# Patient Record
Sex: Female | Born: 1955 | Race: White | Hispanic: No | Marital: Married | Smoking: Never smoker
Health system: Southern US, Community
[De-identification: ages and names within clinical notes are randomized; demographics above are authoritative.]

## PROBLEM LIST (undated history)

## (undated) DIAGNOSIS — M940 Chondrocostal junction syndrome [Tietze]: Secondary | ICD-10-CM

## (undated) DIAGNOSIS — K219 Gastro-esophageal reflux disease without esophagitis: Secondary | ICD-10-CM

## (undated) DIAGNOSIS — F329 Major depressive disorder, single episode, unspecified: Secondary | ICD-10-CM

## (undated) DIAGNOSIS — G43909 Migraine, unspecified, not intractable, without status migrainosus: Secondary | ICD-10-CM

## (undated) DIAGNOSIS — F32A Depression, unspecified: Secondary | ICD-10-CM

## (undated) DIAGNOSIS — N393 Stress incontinence (female) (male): Secondary | ICD-10-CM

## (undated) DIAGNOSIS — E785 Hyperlipidemia, unspecified: Secondary | ICD-10-CM

## (undated) DIAGNOSIS — K5790 Diverticulosis of intestine, part unspecified, without perforation or abscess without bleeding: Secondary | ICD-10-CM

## (undated) DIAGNOSIS — K621 Rectal polyp: Secondary | ICD-10-CM

## (undated) DIAGNOSIS — K5730 Diverticulosis of large intestine without perforation or abscess without bleeding: Secondary | ICD-10-CM

## (undated) HISTORY — DX: Diverticulosis of large intestine without perforation or abscess without bleeding: K57.30

## (undated) HISTORY — DX: Diverticulosis of intestine, part unspecified, without perforation or abscess without bleeding: K57.90

## (undated) HISTORY — DX: Hyperlipidemia, unspecified: E78.5

## (undated) HISTORY — DX: Rectal polyp: K62.1

## (undated) HISTORY — PX: UMBILICAL HERNIA REPAIR: SHX196

## (undated) HISTORY — DX: Chondrocostal junction syndrome (tietze): M94.0

## (undated) HISTORY — DX: Gastro-esophageal reflux disease without esophagitis: K21.9

---

## 1992-02-05 HISTORY — PX: ABDOMINAL HYSTERECTOMY: SHX81

## 1997-08-10 ENCOUNTER — Other Ambulatory Visit: Admission: RE | Admit: 1997-08-10 | Discharge: 1997-08-10 | Payer: Self-pay | Admitting: Sports Medicine

## 1997-11-02 ENCOUNTER — Other Ambulatory Visit: Admission: RE | Admit: 1997-11-02 | Discharge: 1997-11-02 | Payer: Self-pay | Admitting: Obstetrics and Gynecology

## 1999-03-05 ENCOUNTER — Encounter: Admission: RE | Admit: 1999-03-05 | Discharge: 1999-03-05 | Payer: Self-pay | Admitting: Obstetrics and Gynecology

## 1999-03-05 ENCOUNTER — Encounter: Payer: Self-pay | Admitting: Obstetrics and Gynecology

## 2000-06-06 ENCOUNTER — Other Ambulatory Visit: Admission: RE | Admit: 2000-06-06 | Discharge: 2000-06-06 | Payer: Self-pay | Admitting: Obstetrics and Gynecology

## 2001-05-30 ENCOUNTER — Emergency Department (HOSPITAL_COMMUNITY): Admission: EM | Admit: 2001-05-30 | Discharge: 2001-05-30 | Payer: Self-pay | Admitting: *Deleted

## 2003-05-05 ENCOUNTER — Other Ambulatory Visit: Admission: RE | Admit: 2003-05-05 | Discharge: 2003-05-05 | Payer: Self-pay | Admitting: Obstetrics and Gynecology

## 2004-12-03 ENCOUNTER — Encounter: Admission: RE | Admit: 2004-12-03 | Discharge: 2004-12-03 | Payer: Self-pay | Admitting: Neurology

## 2006-02-04 HISTORY — PX: COLONOSCOPY: SHX174

## 2007-08-20 ENCOUNTER — Ambulatory Visit: Payer: Self-pay | Admitting: Internal Medicine

## 2007-08-25 ENCOUNTER — Telehealth: Payer: Self-pay | Admitting: Internal Medicine

## 2007-08-31 ENCOUNTER — Encounter: Payer: Self-pay | Admitting: Internal Medicine

## 2007-08-31 ENCOUNTER — Ambulatory Visit: Payer: Self-pay | Admitting: Internal Medicine

## 2007-09-03 ENCOUNTER — Encounter: Payer: Self-pay | Admitting: Internal Medicine

## 2010-02-04 HISTORY — PX: BREAST SURGERY: SHX581

## 2010-11-19 ENCOUNTER — Ambulatory Visit (HOSPITAL_COMMUNITY)
Admission: RE | Admit: 2010-11-19 | Discharge: 2010-11-19 | Disposition: A | Discharge status: Home / Self Care | Payer: BC Managed Care – PPO | Attending: Psychiatry | Admitting: Psychiatry

## 2010-11-19 DIAGNOSIS — F332 Major depressive disorder, recurrent severe without psychotic features: Secondary | ICD-10-CM | POA: Insufficient documentation

## 2011-02-23 ENCOUNTER — Encounter (HOSPITAL_COMMUNITY): Payer: Self-pay | Admitting: Emergency Medicine

## 2011-02-23 ENCOUNTER — Emergency Department (HOSPITAL_COMMUNITY)
Admission: EM | Admit: 2011-02-23 | Discharge: 2011-02-23 | Disposition: A | Discharge status: Home / Self Care | Payer: BC Managed Care – PPO | Source: Home / Self Care | Attending: Family Medicine | Admitting: Family Medicine

## 2011-02-23 DIAGNOSIS — K5792 Diverticulitis of intestine, part unspecified, without perforation or abscess without bleeding: Secondary | ICD-10-CM

## 2011-02-23 DIAGNOSIS — K5732 Diverticulitis of large intestine without perforation or abscess without bleeding: Secondary | ICD-10-CM

## 2011-02-23 HISTORY — DX: Depression, unspecified: F32.A

## 2011-02-23 HISTORY — DX: Migraine, unspecified, not intractable, without status migrainosus: G43.909

## 2011-02-23 HISTORY — DX: Stress incontinence (female) (male): N39.3

## 2011-02-23 HISTORY — DX: Major depressive disorder, single episode, unspecified: F32.9

## 2011-02-23 LAB — POCT URINALYSIS DIP (DEVICE)
Hgb urine dipstick: NEGATIVE
Specific Gravity, Urine: >=1.03 (ref 1.005–1.030)
pH: 5 (ref 5.0–8.0)

## 2011-02-23 MED ORDER — CIPROFLOXACIN HCL 500 MG PO TABS: 500.0000 mg | ORAL_TABLET | Freq: Two times a day (BID) | ORAL | Status: AC

## 2011-02-23 MED ORDER — METRONIDAZOLE 500 MG PO TABS: 500.0000 mg | ORAL_TABLET | Freq: Three times a day (TID) | ORAL | Status: AC

## 2011-02-23 MED ORDER — ONDANSETRON HCL 4 MG PO TABS: 4.0000 mg | ORAL_TABLET | Freq: Three times a day (TID) | ORAL | Status: AC | PRN

## 2011-02-23 NOTE — ED Notes (Addendum)
Pt. Called re: unresolved diarrhea symptoms. Dr. Juanetta Gosling was consulted and pt was advised to give the medication 48 hrs to take full effect; to return to ED if symptoms worsen. Pt also reminded to avoid foods that exacerbate diverticulitis, and to try the "B.R.A.T." diet until the medication takes full effect and not to resort to Imodium as that might prolong the healing process. Physician also advises that pt may alternate 250mg  acetominophen with 200mg  ibuprofen up to 4x per day for pain.

## 2011-02-23 NOTE — ED Notes (Signed)
Patient also feels urgent need to urinate, but not urinating with this sensation.  Urinated early this am.

## 2011-02-23 NOTE — ED Provider Notes (Signed)
History     CSN: [id number]  Arrival date & time 02/23/11  4098   First MD Initiated Contact with Patient 02/23/11 1005      Chief Complaint  Patient presents with  . Diarrhea    onset one week ago last wednesday.  "explosive diarrhea " following a meal.  next few days spent in bed, continued diarrhea.  went back to work this week.  mid week ate normal diet followed by vomiting, diarrhea, "bubbling" sensation in abdomen.  has had 2 bm's this am one was diarrhea, one was loose/soft.      (Consider location/radiation/quality/duration/timing/severity/associated sxs/prior treatment) HPI Comments: Alice Cruz presents for evaluation of 10 days of watery diarrhea and loose stools. She now reports onset of worsening LEFT lower quadrant pain. She has known diverticular disease on colonoscopy. She denies any fever, and is tolerating PO well. She reports similar sx in her daughter and grandson at the beginning of her symptoms. She also now reports urinary urgency and frequency without dysuria. She takes Vesicare for stress incontinence.   Patient is a 56 y.o. female presenting with diarrhea. The history is provided by the patient.  Diarrhea The primary symptoms include abdominal pain and diarrhea. Primary symptoms do not include fever, weight loss, nausea, vomiting or dysuria. The illness began more than 7 days ago. The onset was sudden. The problem has not changed since onset. The abdominal pain began more than 2 days ago. The abdominal pain has been unchanged since its onset. The abdominal pain is located in the LLQ. The abdominal pain does not radiate. The abdominal pain is relieved by nothing.  The diarrhea began today. The diarrhea is watery and semi-solid.  Significant associated medical issues include diverticulitis.    Past Medical History  Diagnosis Date  . Migraines   . Depression   . Stress incontinence, female     Past Surgical History  Procedure Date  . Cesarean section   .  Abdominal hysterectomy   . Breast surgery     No family history on file.  History  Substance Use Topics  . Smoking status: Never Smoker   . Smokeless tobacco: Not on file  . Alcohol Use: Yes    OB History    Grav Para Term Preterm Abortions TAB SAB Ect Mult Living                  Review of Systems  Constitutional: Negative.  Negative for fever, weight loss and appetite change.  HENT: Negative.   Eyes: Negative.   Respiratory: Negative.   Cardiovascular: Negative.   Gastrointestinal: Positive for abdominal pain and diarrhea. Negative for nausea, vomiting and blood in stool.  Genitourinary: Positive for urgency and frequency. Negative for dysuria.  Musculoskeletal: Negative.   Skin: Negative.   Neurological: Negative.     Allergies  Review of patient's allergies indicates no known allergies.  Home Medications   Current Outpatient Rx  Name Route Sig Dispense Refill  . CLONAZEPAM 1 MG PO TABS Oral Take 1 mg by mouth 2 (two) times daily as needed.    Marland Kitchen PROMETHAZINE HCL 25 MG PO TABS Oral Take 25 mg by mouth every 6 (six) hours as needed.    Marland Kitchen SOLIFENACIN SUCCINATE 10 MG PO TABS Oral Take 5 mg by mouth daily.    Marland Kitchen VILAZODONE HCL 40 MG PO TABS Oral Take by mouth daily.    Marland Kitchen ZONEGRAN PO Oral Take by mouth.    Marland Kitchen CIPROFLOXACIN HCL 500 MG PO TABS  Oral Take 1 tablet (500 mg total) by mouth every 12 (twelve) hours. 14 tablet 0  . METRONIDAZOLE 500 MG PO TABS Oral Take 1 tablet (500 mg total) by mouth 3 (three) times daily. 21 tablet 0  . ONDANSETRON HCL 4 MG PO TABS Oral Take 1 tablet (4 mg total) by mouth every 8 (eight) hours as needed for nausea. 15 tablet 0    BP 109/72  Pulse 77  Temp(Src) 97.4 F (36.3 C) (Oral)  Resp 17  SpO2 97%  Physical Exam  Nursing note and vitals reviewed. Constitutional: She is oriented to person, place, and time. She appears well-developed and well-nourished.  HENT:  Head: Normocephalic and atraumatic.  Eyes: EOM are normal.  Neck:  Normal range of motion.  Pulmonary/Chest: Effort normal.  Abdominal: Soft. Normal appearance and bowel sounds are normal. There is tenderness in the left lower quadrant. There is no rebound and no guarding.  Musculoskeletal: Normal range of motion.  Neurological: She is alert and oriented to person, place, and time.  Skin: Skin is warm and dry.  Psychiatric: Her behavior is normal.    ED Course  Procedures (including critical care time)  Labs Reviewed  POCT URINALYSIS DIP (DEVICE) - Abnormal; Notable for the following:    Bilirubin Urine MODERATE (*)    Ketones, ur TRACE (*)    All other components within normal limits  POCT URINALYSIS DIPSTICK   No results found.   1. Diverticulitis       MDM  Labs reviewed; will treat for diverticulitis w cipro and metronidazole; ondansetron; return if no improvement        Richardo Priest, MD 02/23/11 1126

## 2011-02-25 ENCOUNTER — Encounter: Payer: Self-pay | Admitting: Nurse Practitioner

## 2011-02-25 ENCOUNTER — Ambulatory Visit (INDEPENDENT_AMBULATORY_CARE_PROVIDER_SITE_OTHER): Payer: BC Managed Care – PPO | Admitting: Nurse Practitioner

## 2011-02-25 ENCOUNTER — Other Ambulatory Visit (INDEPENDENT_AMBULATORY_CARE_PROVIDER_SITE_OTHER): Payer: BC Managed Care – PPO

## 2011-02-25 ENCOUNTER — Other Ambulatory Visit: Payer: BC Managed Care – PPO

## 2011-02-25 ENCOUNTER — Telehealth: Payer: Self-pay | Admitting: *Deleted

## 2011-02-25 ENCOUNTER — Telehealth: Payer: Self-pay | Admitting: Internal Medicine

## 2011-02-25 VITALS — BP 90/64 | HR 80 | Ht 61.0 in | Wt 137.0 lb

## 2011-02-25 DIAGNOSIS — R197 Diarrhea, unspecified: Secondary | ICD-10-CM

## 2011-02-25 DIAGNOSIS — R1032 Left lower quadrant pain: Secondary | ICD-10-CM

## 2011-02-25 LAB — COMPREHENSIVE METABOLIC PANEL
ALT: 24 U/L (ref 0–35)
AST: 20 U/L (ref 0–37)
BUN: 13 mg/dL (ref 6–23)
CO2: 27 mEq/L (ref 19–32)
Creatinine, Ser: 1 mg/dL (ref 0.4–1.2)
GFR: 59.98 mL/min — ABNORMAL LOW (ref >60.00)
Total Bilirubin: 0.5 mg/dL (ref 0.3–1.2)

## 2011-02-25 LAB — CBC WITH DIFFERENTIAL/PLATELET
Basophils Absolute: 0 10*3/uL (ref 0.0–0.1)
Basophils Relative: 0.7 % (ref 0.0–3.0)
Eosinophils Absolute: 0.1 10*3/uL (ref 0.0–0.7)
HCT: 38.3 % (ref 36.0–46.0)
Hemoglobin: 13.2 g/dL (ref 12.0–15.0)
Lymphs Abs: 1.4 10*3/uL (ref 0.7–4.0)
MCHC: 34.4 g/dL (ref 30.0–36.0)
Neutro Abs: 2.4 10*3/uL (ref 1.4–7.7)
RDW: 13.6 % (ref 11.5–14.6)

## 2011-02-25 NOTE — Telephone Encounter (Signed)
I left a message for the patient to please call me.  I advised I did need her to come back up to our office after she was done at the lab.  I left a message she will need to come here to pick up her contrast and get the directions for the CT scan . I scheduled the CT scan for Wed 02-27-2011 at 9:30 AM. I did leave our number.

## 2011-02-25 NOTE — Patient Instructions (Signed)
Please go to the basement level to have your labs drawn.  We have scheduled the CT scan for Wed 02-27-2011. You can take Hydrocodone 1 tab every 6 hours as needed for pain.  We will call you with the CT scan results and lab results.   You have been scheduled for a CT scan of the abdomen and pelvis at [facility] ([address]---this is in the same building as [facility] [employer]).   You are scheduled on Wednesday at 9:30 AM . You should arrive 15 minutes prior to your appointment time for registration. Please follow the written instructions below on the day of your exam:  WARNING: IF YOU ARE ALLERGIC TO IODINE/X-RAY DYE, PLEASE NOTIFY RADIOLOGY IMMEDIATELY AT [phone]! YOU WILL BE GIVEN A 13 HOUR PREMEDICATION PREP.  1) Do not eat or drink anything after 5:30 AM  2) You have been given 2 bottles of oral contrast to drink. The solution may tastebetter if refrigerated, but do NOT add ice or any other liquid to this solution. Shake well before drinking.    Drink 1 bottle of contrast @ 7:30 AM(2 hours prior to your exam)  Drink 1 bottle of contrast @ 8:30 AM (1 hour prior to your exam)  You may take any medications as prescribed with a small amount of water except for the following: Metformin, Glucophage, Glucovance, Avandamet, Riomet, Fortamet, Actoplus Met, Janumet, Glumetza or Metaglip. The above medications must be held the day of the exam AND 48 hours after the exam.  The purpose of you drinking the oral contrast is to aid in the visualization of your intestinal tract. The contrast solution may cause some diarrhea. Before your exam is started, you will be given a small amount of fluid to drink. Depending on your individual set of symptoms, you may also receive an intravenous injection of x-ray contrast/dye. Plan on being at [facility] for 30 minutes or long, depending on the type of exam you are having performed.  If you have any questions regarding your exam or if  you need to reschedule, you may call the CT department at [phone] between the hours of 8:00 am and 5:00 pm, Monday-Friday.  ________________________________________________________________________

## 2011-02-25 NOTE — Telephone Encounter (Signed)
I called [facility] and rescheduled the CT scan for THurs 02-28-2011 at 10 AM.  I called and Left a message for the patient that I rescheduled the CT scan for Thurs 02-28-2011 at 10 AM.  I advised her to arrive at 9:45 Am and she should drink the 1st bottle at 8 AM and 2nd bottle at 9 AM.  I advised her to call me if she has any questions.

## 2011-02-25 NOTE — Telephone Encounter (Signed)
[facility] and abdominal pain.  They started her on cipro and flagyl for presumed diverticulitis.  She has had diarrhea for over 2 weeks she missed 6 days of work due to diarrhea 2 weeks ago.  She is still having diarrhea and abdominal pain and wants to be seen. I have offered her an appt for today with Willette Cluster RNP she refuses only wants to see an MD today " I can't miss any more work".  I have asked her again to reconsider and come see Willette Cluster RNP she refuses again and states she will return to the urgent care

## 2011-02-26 LAB — FECAL LACTOFERRIN, QUANT: Lactoferrin: POSITIVE

## 2011-02-26 NOTE — Telephone Encounter (Signed)
I called the home number again today @ 8:30 AM and left another message regarding the date and time of the CT scan which is Thurs 02-28-2011 at 10 Am.  Pt is to NPO after 5:30 AM and she is to drink the contrast at 8 Am and 9 Am.  I also advised that she would need to pick up the oral contrast at our front desk.

## 2011-02-26 NOTE — Progress Notes (Signed)
02/26/2011 MELISHA EGGLETON [id number] 11/23/1955   HISTORY OF PRESENT ILLNESS: Patient is a 56 year old female who had a screening colonoscopy with Dr. Leone Payor in 2007. She is worked in today for evaluation of non-bloody diarrhea and abdominal pain. Several days ago patient developed diarrheal illness which she thought was a stomach bug. No associated nausea. No abdominal pain at onset of diarrhea. Because of the illness patient missed several days of work. Diarrhea sounds low volume. No nocturnal diarrhea. 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 No recent out of the country travel. No recent antibiotics. She began to feel better a few days ago but then diarrhea recurred and she developed acute LLQ pain. Patient went to Urgent Care and given antibiotics for presumed diverticulitis. No dysuria. Urine dipstick at urgent care positive for bilirubin and trace ketones only. She has been on the Cipro and Flagyl for a couple of days now without any significant improvement. She has a bad taste in her mouth  Patient wonders if her anti-depressant could be causing the diarrhea as she recently increased dose and the medication is known to cause diarrhea.    Past Medical History  Diagnosis Date  . Migraines   . Depression   . Stress incontinence, female   . Diverticulosis   . HLD (hyperlipidemia)    Past Surgical History  Procedure Date  . Cesarean section     x 2  . Abdominal hysterectomy   . Breast surgery     breast reduction  . Umbilical hernia repair 08/16/2010    reports that she has never smoked. She has never used smokeless tobacco. She reports that she drinks alcohol. She reports that she does not use illicit drugs. family history includes Diabetes in her maternal aunt, maternal grandmother, and maternal uncle and Heart disease in her paternal grandmother. Allergies  Allergen Reactions  . Nsaids     Long term       Outpatient Encounter  Prescriptions as of 02/25/2011  Medication Sig Dispense Refill  . ciprofloxacin (CIPRO) 500 MG tablet Take 1 tablet (500 mg total) by mouth every 12 (twelve) hours.  14 tablet  0  . clonazePAM (KLONOPIN) 1 MG tablet Take 1 mg by mouth 2 (two) times daily as needed.      . metroNIDAZOLE (FLAGYL) 500 MG tablet Take 1 tablet (500 mg total) by mouth 3 (three) times daily.  21 tablet  0  . ondansetron (ZOFRAN) 4 MG tablet Take 1 tablet (4 mg total) by mouth every 8 (eight) hours as needed for nausea.  15 tablet  0  . promethazine (PHENERGAN) 25 MG tablet Take 25 mg by mouth every 6 (six) hours as needed.      . solifenacin (VESICARE) 10 MG tablet Take 5 mg by mouth daily.      . Vilazodone HCl (VIIBRYD) 40 MG TABS Take by mouth daily.      . Zonisamide (ZONEGRAN PO) Take by mouth.         REVIEW OF SYSTEMS  : Positive for feeling a little "loopy". Positive for urinary frequency without dysuria. All other systems reviewed and negative except where noted in the History of Present Illness.   PHYSICAL EXAM: BP 90/64  Pulse 80  Ht 5\' 1"  (1.549 m)  Wt 62.143 kg (137 lb)  BMI 25.89 kg/m2 General: Well developed white female in no acute distress Head: Normocephalic and atraumatic Eyes:  sclerae anicteric,conjunctive pink. Ears: Normal  auditory acuity Mouth: No deformity or lesions Neck: Supple, no masses.  Lungs: Clear throughout to auscultation Heart: Regular rate and rhythm; no murmurs heard Abdomen: Soft, non distended, nontender. No masses or hepatomegaly noted. Normal Bowel sounds Rectal: not done Musculoskeletal: Symmetrical with no gross deformities  Skin: No lesions on visible extremities Extremities: No edema or deformities noted Neurological: Alert oriented x 4, grossly nonfocal Cervical Nodes:  No significant cervical adenopathy Psychological:  Alert and cooperative. Normal mood and affect  ASSESSMENT AND PLAN;   Several day history of non-bloody diarrhea, now with new onset of  LLQ pain. This could be viral but illness has been going on longer than expected for a viral etiology. Rule out C-Diff or other causes of infectious colitis. With onset of LLQ pain diverticulitis is possible but it isn't usually associated with loose stool. Inflammatory bowel disease is not high on differential diagnoses list. Loose stools could be secondary to Viibryd, especially given recent dose increase. Her associated LLQ pain makes medication side effect seem less likely though not impossible. Her abdominal exam is not overly concerning. It is reasonable to check some labs and stool studies. If negative and pain persist then she will likely need CTscan and / or lower endoscopy depending on her symptoms.  Recommend continuing Cipro and Flagyl until labs and stool studies return. She has Hydrocodone at home and can take one every 6 hours as needed. I will call her with test results and further recommendations after review of labs. Patient has appointment with psychiatrist in a couple of days. She plans to have dose of Viibryd reduced to see if it helps diarrhea.

## 2011-02-26 NOTE — Telephone Encounter (Signed)
Called pt at her work number and asked her if she got my message.  She said she or her husband will pick the contrast up tomorrow the 23rd.  She mentioned Gunnar Fusi said if the stool studies come back negative she wouldn't need the CT scan.  I told her Gunnar Fusi or myself will call her with the results of the stool studies.

## 2011-02-27 ENCOUNTER — Telehealth: Payer: Self-pay | Admitting: Nurse Practitioner

## 2011-02-27 ENCOUNTER — Telehealth: Payer: Self-pay | Admitting: Gastroenterology

## 2011-02-27 ENCOUNTER — Other Ambulatory Visit: Payer: BC Managed Care – PPO

## 2011-02-27 NOTE — Progress Notes (Signed)
i agree with the plan outlined in this note 

## 2011-02-27 NOTE — Telephone Encounter (Signed)
The patient said she wanted to cancel the CT scan due to her not having the pain she was having at this time.  She said the antidepressants she is on is causing diarrhea.  I told her I would cancel it and call Gunnar Fusi to advise.  I did call Gunnar Fusi and she said it was fine to cancel the CT scan.  Gunnar Fusi said that she hasn't seen all the stool study results yet.  I did tell her most of them looked negative expect for the lactoferrin was positive.

## 2011-02-27 NOTE — Telephone Encounter (Signed)
On call note @ 1745. Pt complaining of ongoing diarrhea, not improving, and calling for stool culture results. No fevers, N/V or abd pain. I reviewed her office note from Monday and her stool studies: C diff negative, lactoferrin positive and culture pending. Advised her to push clear liquids, continue Cipro and Flagyl and start Lomotil bid prn. Call the office between 8a and 5p tomorrow discuss further plans with Willette Cluster, NP.

## 2011-02-28 ENCOUNTER — Other Ambulatory Visit: Payer: BC Managed Care – PPO

## 2011-02-28 ENCOUNTER — Other Ambulatory Visit: Payer: Self-pay | Admitting: *Deleted

## 2011-02-28 ENCOUNTER — Telehealth: Payer: Self-pay | Admitting: Nurse Practitioner

## 2011-02-28 MED ORDER — DIPHENOXYLATE-ATROPINE 2.5-0.025 MG PO TABS: ORAL_TABLET | ORAL | Status: DC

## 2011-02-28 NOTE — Telephone Encounter (Signed)
See open encounter

## 2011-02-28 NOTE — Telephone Encounter (Signed)
The patient had talked to Dr Russella Dar after hours last night at 5 :45 PM.  He she was saying she is not any better, diarrhea but no abd pain, Nausea or vomiting.  He advised her to continue the Cipro and Flagyl and to take Lomotil twice daily. He told her to call our office today.  She did call and I called Gunnar Fusi first to tell Gunnar Fusi about her talking to Dr Russella Dar.  She agreed with his suggestions and told me to tell her to stay hydrated and push fluids. I called the patient to advise and she said she was almost out of the Lomotil.  I told her I would send a refill to her pharmacy, [facility] [address]. I told her we will call once we have a final report on the stool culture test.

## 2011-03-01 LAB — STOOL CULTURE

## 2011-03-01 NOTE — Telephone Encounter (Signed)
I called the patient and asked how she was doing.  She said she is much better. She is having no more diarrhea. She is only going 1-2 times a day.  She has no pain. She mentioned the psychiatrist took her off some of her medicatons and she thinks they were causing her some of the problems.  I also advised her that the stool culture results are final and they were totally normal.  She thanked me for calling.

## 2011-03-02 ENCOUNTER — Encounter (HOSPITAL_COMMUNITY): Payer: Self-pay | Admitting: Emergency Medicine

## 2011-03-05 ENCOUNTER — Telehealth: Payer: Self-pay | Admitting: Nurse Practitioner

## 2011-03-07 NOTE — Telephone Encounter (Signed)
Gunnar Fusi had called the pt on the 03-05-2011 and had to leave a message.  This was put in my in box.  I called the patient and she told me Gunnar Fusi had called her.  I told her I would tell Gunnar Fusi I called . The patient told me she was feeling better.

## 2011-04-29 ENCOUNTER — Ambulatory Visit (INDEPENDENT_AMBULATORY_CARE_PROVIDER_SITE_OTHER): Payer: BC Managed Care – PPO | Admitting: Family Medicine

## 2011-04-29 VITALS — BP 102/66 | HR 91 | Temp 97.9°F | Resp 18 | Ht 61.5 in | Wt 131.0 lb

## 2011-04-29 DIAGNOSIS — J029 Acute pharyngitis, unspecified: Secondary | ICD-10-CM

## 2011-04-29 NOTE — Progress Notes (Signed)
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 She also feels like there's a lump in the back of her throat which she cannot clear. She now has a soreness on the left side of her tongue with loss of appetite. She has sinus congestion as well.  Objective: No acute distress  Throat: Red without exudates but does show postnasal drainage, aphthous ulcer on left side, Respiratory distress TMs normal Neck is supple no adenopathy  Assessment: Approximately 5 weeks of sinus congestion, cough and sore throat  Plan chlorhexidine gluconate mouthwashes and amoxicillin 75 twice a day for a week

## 2011-07-20 ENCOUNTER — Ambulatory Visit (INDEPENDENT_AMBULATORY_CARE_PROVIDER_SITE_OTHER): Payer: BC Managed Care – PPO | Admitting: Family Medicine

## 2011-07-20 VITALS — BP 99/66 | HR 67 | Temp 97.7°F | Resp 16 | Ht 62.0 in | Wt 133.6 lb

## 2011-07-20 DIAGNOSIS — K219 Gastro-esophageal reflux disease without esophagitis: Secondary | ICD-10-CM

## 2011-07-20 DIAGNOSIS — R1310 Dysphagia, unspecified: Secondary | ICD-10-CM

## 2011-07-20 MED ORDER — SUCRALFATE 1 G PO TABS: 1.0000 g | ORAL_TABLET | Freq: Four times a day (QID) | ORAL | Status: DC

## 2011-07-20 NOTE — Patient Instructions (Addendum)
Buy zantac and take according to the package directions for at least 2 weeks.  Take the carafate 4x daily- before meals and before bed.    We will work on calling Dr. Leone Payor, but you might also try calling yourself-  [phone]

## 2011-07-20 NOTE — Progress Notes (Signed)
Patient Name: EMIKA TIANO Date of Birth: 1956/01/12 Medical Record Number: [id number] Gender: female Date of Encounter: 07/20/2011  History of Present Illness:  ANIYHA TATE is a 56 y.o. very pleasant female patient who presents with the following:  She has noted a feeling of indigestion, like something is "stuck" in her upper esophagus for the last 3 or 4 weeks- it had been just at night, but over the last few days it has been more persistent.  She also notes that she feels worse after she lays down at night. She "feels bad" between her upper throat and her sternum.  She feels that she needs to burp but cannot.  She has used Catering manager which helped some, and omeprazole OTC which did not help.  She does not note pain or "regurgitation" of food.  No epigastric pain or burning, and foods/ liquids are NOT actually getting stuck.  She has not noted any other symptoms such as diarrhea or constipation.    She has a history of costochondritis, but has not had any recent CP.  She had "a couple of episodes" of her typical costochondritis pain over the last month or so- she can reproduce the pain and it is non- exertional.   No SOB.    There is no problem list on file for this patient.  Past Medical History  Diagnosis Date  . Migraines   . Depression   . Stress incontinence, female   . Diverticulosis   . HLD (hyperlipidemia)    Past Surgical History  Procedure Date  . Cesarean section     x 2  . Abdominal hysterectomy   . Breast surgery     breast reduction  . Umbilical hernia repair 08/16/2010   History  Substance Use Topics  . Smoking status: Never Smoker   . Smokeless tobacco: Never Used  . Alcohol Use: Yes     ocass   Family History  Problem Relation Age of Onset  . Diabetes Maternal Aunt   . Diabetes Maternal Uncle   . Diabetes Maternal Grandmother   . Heart disease Paternal Grandmother    Allergies  Allergen Reactions  . Doxycycline Calcium Diarrhea    . Nsaids     Long term     Medication list has been reviewed and updated.  Prior to Admission medications   Medication Sig Start Date End Date Taking? Authorizing Provider  clonazePAM (KLONOPIN) 1 MG tablet Take 1 mg by mouth 2 (two) times daily as needed.   Yes Historical Provider, MD  escitalopram (LEXAPRO) 10 MG tablet Take 10 mg by mouth daily.   Yes Historical Provider, MD  solifenacin (VESICARE) 10 MG tablet Take 5 mg by mouth daily.   Yes Historical Provider, MD  Zonisamide (ZONEGRAN PO) Take by mouth.   Yes Historical Provider, MD  diphenoxylate-atropine (LOMOTIL) 2.5-0.025 MG per tablet Take 1 tab twice daily. 02/28/11   Meredith Pel, NP  promethazine (PHENERGAN) 25 MG tablet Take 25 mg by mouth every 6 (six) hours as needed.    Historical Provider, MD  Vilazodone HCl (VIIBRYD) 40 MG TABS Take by mouth daily.    Historical Provider, MD    Review of Systems:  As per HPI- otherwise negative. Normal colonoscopy except for diverticulosis in 2007- she was given a 10 year recall.    Physical Examination: Filed Vitals:   07/20/11 1045  BP: 99/66  Pulse: 67  Temp: 97.7 F (36.5 C)  Resp: 16  Filed Vitals:   07/20/11 1045  Height: 5\' 2"  (1.575 m)  Weight: 133 lb 9.6 oz (60.601 kg)   Body mass index is 24.44 kg/(m^2). Ideal Body Weight: Weight in (lb) to have BMI = 25: 136.4   GEN: WDWN, NAD, Non-toxic, A & O x 3, looks well HEENT: Atraumatic, Normocephalic. Neck supple. No masses, No LAD.  Tm wnl bilaterally, oropharynx wnl Ears and Nose: No external deformity. CV: RRR, No M/G/R. No JVD. No thrill. No extra heart sounds. PULM: CTA B, no wheezes, crackles, rhonchi. No retractions. No resp. distress. No accessory muscle use. ABD: S, ND, +BS. No rebound. No HSM. No reproducible pain over her stomach, but gastric pressure is uncomfortable.  No reproducible chest wall tenderness.  History of breast reduction surgery.  EXTR: No c/c/e NEURO Normal gait.  PSYCH: Normally  interactive. Conversant. Not depressed or anxious appearing.  Calm demeanor.   EKG: NSR, no ST elevation or depression, no change from previous EKG.    Assessment and Plan: 1. GERD (gastroesophageal reflux disease)  sucralfate (CARAFATE) 1 G tablet, Ambulatory referral to Gastroenterology  2. Dysphagia     Suspect that Ashleigh is actually having GERD, and could have developed an esophageal stricture.  Went over lifestyle changes (avoidance of acidic foods, etc) and will have her take OTC zantac and carafate.  Will also work on getting her back in to see Dr. Leone Payor who did her colonoscopy (normal) in 2007.  She will let us know if she is getting worse in the meantime.    Abbe Amsterdam, MD

## 2011-07-23 ENCOUNTER — Telehealth: Payer: Self-pay | Admitting: Internal Medicine

## 2011-07-23 NOTE — Telephone Encounter (Signed)
Patient reports that she has a globus sensation in her esophagus.  She was recently seen by Dr Patsy Lager that has referred her back for GERD.  She reports 3 weeks of this sensation and she is about to go out of town for vacation.  She will come in and see Mike Gip PA tomorrow at 10:00

## 2011-07-24 ENCOUNTER — Ambulatory Visit (INDEPENDENT_AMBULATORY_CARE_PROVIDER_SITE_OTHER): Payer: BC Managed Care – PPO | Admitting: Physician Assistant

## 2011-07-24 ENCOUNTER — Ambulatory Visit (HOSPITAL_COMMUNITY)
Admission: RE | Admit: 2011-07-24 | Discharge: 2011-07-24 | Disposition: A | Discharge status: Home / Self Care | Payer: BC Managed Care – PPO | Source: Ambulatory Visit | Attending: Physician Assistant | Admitting: Physician Assistant

## 2011-07-24 ENCOUNTER — Encounter: Payer: Self-pay | Admitting: Physician Assistant

## 2011-07-24 VITALS — BP 92/66 | HR 60 | Ht 61.0 in | Wt 131.8 lb

## 2011-07-24 DIAGNOSIS — F419 Anxiety disorder, unspecified: Secondary | ICD-10-CM

## 2011-07-24 DIAGNOSIS — R1310 Dysphagia, unspecified: Secondary | ICD-10-CM

## 2011-07-24 DIAGNOSIS — K5730 Diverticulosis of large intestine without perforation or abscess without bleeding: Secondary | ICD-10-CM

## 2011-07-24 DIAGNOSIS — K5790 Diverticulosis of intestine, part unspecified, without perforation or abscess without bleeding: Secondary | ICD-10-CM | POA: Insufficient documentation

## 2011-07-24 DIAGNOSIS — F411 Generalized anxiety disorder: Secondary | ICD-10-CM

## 2011-07-24 DIAGNOSIS — F32A Depression, unspecified: Secondary | ICD-10-CM

## 2011-07-24 DIAGNOSIS — F329 Major depressive disorder, single episode, unspecified: Secondary | ICD-10-CM | POA: Insufficient documentation

## 2011-07-24 DIAGNOSIS — F3289 Other specified depressive episodes: Secondary | ICD-10-CM

## 2011-07-24 DIAGNOSIS — N393 Stress incontinence (female) (male): Secondary | ICD-10-CM

## 2011-07-24 MED ORDER — OMEPRAZOLE-SODIUM BICARBONATE 40-1100 MG PO CAPS: 1.0000 | ORAL_CAPSULE | Freq: Every day | ORAL | Status: DC

## 2011-07-24 NOTE — Progress Notes (Signed)
Subjective:    Patient ID: Alice Cruz, female    DOB: August 20, 1955, 56 y.o.   MRN: [id number]  HPI Alice Cruz is a pleasant 56 year old female known to Dr. Leone Payor with history of diverticulosis. She underwent colonoscopy in 2009 which showed severe sigmoid diverticulosis and a somewhat angulated sigmoid. She also had a 3 mm polyp removed which was hyperplastic. She has not had prior endoscopy. She said she had onset about 3 weeks ago with symptoms of a fullness in her throat "like there is something stuck". She says this seems to be relieved by belching but then comes right back. When. carefully questioned about difficulty swallowing she is not having any symptoms with liquids and denies any dysphagia or odynophagia with solid foods. She does have a gnawing sensation in her epigastrium. Her appetite has been somewhat decreased but she has not lost any weight. She was seen by primary care and started on Carafate recently in pill form which has  not made any difference. She also tried a dose or two of Zantac and omeprazole but did not stay on anything.  When asked about stress she admits that she's been very stressed recently and had been seen by Dr. Dub Mikes her psychiatrist who suggested that she take her Klonopin twice daily. She also had her Zonogran dosage increased.    Review of Systems  Constitutional: Positive for appetite change.  HENT: Positive for trouble swallowing.   Eyes: Negative.   Respiratory: Negative.   Cardiovascular: Negative.   Gastrointestinal: Negative.   Genitourinary: Negative.   Musculoskeletal: Negative.   Skin: Negative.   Neurological: Negative.   Hematological: Negative.   Psychiatric/Behavioral: The patient is nervous/anxious.    Outpatient Prescriptions Prior to Visit  Medication Sig Dispense Refill  . clonazePAM (KLONOPIN) 1 MG tablet Take 1 mg by mouth 2 (two) times daily as needed.      . diphenoxylate-atropine (LOMOTIL) 2.5-0.025 MG per tablet Take 1 tab  twice daily.  60 tablet  0  . escitalopram (LEXAPRO) 10 MG tablet Take 10 mg by mouth daily.      . promethazine (PHENERGAN) 25 MG tablet Take 25 mg by mouth every 6 (six) hours as needed.      . solifenacin (VESICARE) 10 MG tablet Take 5 mg by mouth daily.      . sucralfate (CARAFATE) 1 G tablet Take 1 tablet (1 g total) by mouth 4 (four) times daily.  40 tablet  1  . Zonisamide (ZONEGRAN PO) Take 150 mg by mouth at bedtime as needed.       . Vilazodone HCl (VIIBRYD) 40 MG TABS Take by mouth daily.       Allergies  Allergen Reactions  . Doxycycline Calcium Diarrhea  . Nsaids     Long term    Patient Active Problem List  Diagnosis  . Anxiety  . Depression  . Diverticulosis  . Urinary, incontinence, stress female   History   Social History  . Marital Status: Married    Spouse Name: N/A    Number of Children: 2  . Years of Education: N/A   Occupational History  . school [employer]   Social History Main Topics  . Smoking status: Never Smoker   . Smokeless tobacco: Never Used  . Alcohol Use: Yes     ocass  . Drug Use: No  . Sexually Active: Not on file   Other Topics Concern  . Not on file   Social History Narrative  .  No narrative on file       Objective:   Physical Exam well-developed white female in no acute distress, pleasant, anxious. Blood pressure 92/66 pulse 60 height 5 foot 1 weight 131. HEENT; nontraumatic normocephalic EOMI PERRLA sclera anicteric,Neck; Supple no JVD, Cardiovascular; regular rate and rhythm with S1-S2 no murmur or gallop, Pulmonary; clear bilaterally, Abdomen; soft, nontender, nondistended, there is no palpable mass or hepatosplenomegaly bowel sounds are active, Rectal; exam not done, Extremities; no clubbing cyanosis or edema skin warm and dry, Psych; mood and affect normal and appropriate.        Assessment & Plan:  #39 56 year old female with "globus" sensation but no true dysphagia or odynophagia . This may be  manifestation of anxiety, cannot rule out GERD  or mild esophagitis. Plan; to stop Carafate  Start Zegerid 40 mg by mouth every morning, patient was given several weeks worth of samples and if helpful we'll give her a prescription  Advised patient to increase her Klonopin to 1 mg by mouth twice daily Schedule for barium swallow with a tablet. Followup with Dr. Leone Payor in one month, other plans pending results of barium swallow

## 2011-07-24 NOTE — Progress Notes (Signed)
Reviewed and agree with management plans.  Jowell Bossi T. Zylie Mumaw MD FACG  

## 2011-07-24 NOTE — Patient Instructions (Addendum)
You are to go to [facility], 1st floor after leaving our office today at 11:00 AM.  Continue the Klonopin twice daily.  We have given you samples of Zegerid 40 mg , 1 tab daily. We did sent a refill to [facility] [address]

## 2011-07-25 ENCOUNTER — Telehealth: Payer: Self-pay | Admitting: Physician Assistant

## 2011-07-25 NOTE — Telephone Encounter (Signed)
Informed pt of BS results; she stated understanding. Mailed her a copy of BS and a reflux diet.

## 2011-07-31 ENCOUNTER — Telehealth: Payer: Self-pay | Admitting: Internal Medicine

## 2011-07-31 NOTE — Telephone Encounter (Signed)
Patient reports that her symptoms have not improved.  She is offered an appt for this week, but she reports she is out of town and won't return until 08/09/11.  She is rescheduled to 08/12/11 3:15

## 2011-08-05 ENCOUNTER — Telehealth: Payer: Self-pay | Admitting: Internal Medicine

## 2011-08-05 NOTE — Telephone Encounter (Signed)
Patient reports she is unable to eat and is having lower stomach pain.  She states that she has also developed back pain in the last week "in my kidney".  She denies nausea, vomiting, diarrhea, constipation, fever or other GI complaints.  She is advised to see her primary care MD for her back pain and come see Willette Cluster RNP tomorrow at 10:30

## 2011-08-06 ENCOUNTER — Ambulatory Visit (INDEPENDENT_AMBULATORY_CARE_PROVIDER_SITE_OTHER): Payer: BC Managed Care – PPO | Admitting: Nurse Practitioner

## 2011-08-06 ENCOUNTER — Other Ambulatory Visit (INDEPENDENT_AMBULATORY_CARE_PROVIDER_SITE_OTHER): Payer: BC Managed Care – PPO

## 2011-08-06 ENCOUNTER — Encounter: Payer: Self-pay | Admitting: Nurse Practitioner

## 2011-08-06 VITALS — BP 110/60 | HR 89 | Ht 61.0 in | Wt 132.8 lb

## 2011-08-06 DIAGNOSIS — R32 Unspecified urinary incontinence: Secondary | ICD-10-CM

## 2011-08-06 DIAGNOSIS — R0789 Other chest pain: Secondary | ICD-10-CM

## 2011-08-06 LAB — URINALYSIS, ROUTINE W REFLEX MICROSCOPIC
Bilirubin Urine: NEGATIVE
Hgb urine dipstick: NEGATIVE
Ketones, ur: NEGATIVE
Specific Gravity, Urine: 1.025 (ref 1.000–1.030)
Urobilinogen, UA: 0.2 (ref 0.0–1.0)

## 2011-08-06 NOTE — Progress Notes (Signed)
Alice Cruz [id number] 03/16/1955   HISTORY OR PRESENT ILLNESS :  Patient is a 56 year old female known to Dr. Leone Payor. She has a history of diverticulosis, last colonoscopy was 2009. Marland Kitchen Patient saw Mike Gip, PA in our office 07/24/11 for fullness in her throat, globus sensation. Her PPI was changed to Zegerid, a Barium Swallow was obtained and patient encouraged to increase Klonopin to BID. Patient is back today with same symptoms. After 3 bites of food she feels from her throat down to epigastrium. Last week she went to Urgent Care with chest pain and persistent swallowing problems. Apparently EKG was negative, nothing prescribed.   Patient also complains of new onset right flank pain occuring when she urinates or defecates. Urine appears normal. BMs are fairly normal. No fevers. Her weight is stable.  Current Medications, Allergies, Past Medical History, Past Surgical History, Family History and Social History were reviewed in [employer] record.   PHYSICAL EXAMINATION : General:  Well developed  female in no acute distress Head: Normocephalic and atraumatic Eyes:  sclerae anicteric,conjunctive pink. Ears: Normal auditory acuity Neck: Supple, no masses.  Lungs: Clear throughout to auscultation Heart: Regular rate and rhythm Abdomen: Soft, nondistended, nontender. No masses or hepatomegaly noted. Normal bowel sounds Musculoskeletal: Symmetrical with no gross deformities  No chest wall tenderness Skin: No lesions on visible extremities Extremities: No edema or deformities noted Neurological: Oriented x 4, grossly nonfocal Cervical Nodes:  No significant cervical adenopathy Psychological:  Pleasant, emotional   ASSESSMENT AND PLAN : 57  56 year old female with 6 week history of fullness in throat and chest with meals. Her weight is stable compared to last visit. Barium swallow is unremarkable. PPI nor Carafate has helped. Etiology unclear but suspect anxiety  may be contributing. We discussed upper endoscopy for further evaluation. Patient realizes the exam may not provide [place] with all the answers. She has asked that Dr. Marina Goodell assume her care. I will talk with Dr. Leone Payor, patient's primary GI, as well as Dr. Marina Goodell about the switch. EGD will be arranged following that.   2. Anxiety / depression, she is followed by psychiatry  3. Right flank / back pain. No CVA tenderness.No fevers. Probably muscular.Will check a urinalysis.

## 2011-08-06 NOTE — Patient Instructions (Addendum)
Please go to the basement level to have your labs drawn.   Willette Cluster ACNP will talk to Dr Marina Goodell and Dr Leone Payor and get back to you.

## 2011-08-07 ENCOUNTER — Telehealth: Payer: Self-pay | Admitting: Nurse Practitioner

## 2011-08-07 NOTE — Telephone Encounter (Signed)
Spoke with patient. She states her husband was upset that she was "jumping to doing the procedure." She states she wants to pursue switching care to Dr. Marina Goodell and give it 3 weeks. She would like to just wait to be seen again before doing a procedure. She is asking for Willette Cluster, NP to call her to discuss.

## 2011-08-08 DIAGNOSIS — R0789 Other chest pain: Secondary | ICD-10-CM | POA: Insufficient documentation

## 2011-08-12 ENCOUNTER — Ambulatory Visit: Payer: BC Managed Care – PPO | Admitting: Internal Medicine

## 2011-08-12 NOTE — Telephone Encounter (Signed)
I called patient this afternoon to let her know Dr. Marina Goodell doesn't want to assume her care. I did encourage a visit / conversation with Dr. Leone Payor for reconciliation. Patient upset about some sedation issues at time of colonoscopy. She felt Dr. Leone Payor was somewhat confrontational with patient's family member regarding Klonopin and its ability to affect sedation. As I explained to patient during her office visit, anxiolytics can definitely affect ability to be sedated. We also discussed Propofol which would be used with any future procedures she may have with [place]. We would like her to remain a patient here, she agrees to reschedule with  Dr. Leone Payor when / if she needs to be seen again.

## 2011-08-12 NOTE — Telephone Encounter (Signed)
No thank you

## 2011-08-12 NOTE — Telephone Encounter (Signed)
Patient does not want to keep appt for today.  I have cancelled the appt.

## 2011-08-12 NOTE — Telephone Encounter (Signed)
I called patient this am. Her swallowing is better after decreasing Zonegran dose. She isn't "100%" but much better. Still wants to switched to Dr. Marina Goodell for any future GI care. I will send note to Drs. Marina Goodell and [place].

## 2011-08-12 NOTE — Progress Notes (Signed)
I AGREE WITH ASSESSMENT AND PLANS 

## 2011-08-12 NOTE — Telephone Encounter (Signed)
She is on my schedule for 315 today - will see her since it is close in time but we should call to clarify if she is going to come since she requested a switch

## 2011-08-12 NOTE — Telephone Encounter (Signed)
Patient has had these symptoms for over 6 weeks, barium swallow was negative and she isn't better on PPI but she can certainly hold off on EGD if desired. She wants to switch care from Dr. Leone Payor to Dr. Marina Goodell, I will be forwarding that request to both of them and we can go from there.

## 2011-08-15 ENCOUNTER — Ambulatory Visit: Payer: BC Managed Care – PPO | Admitting: Internal Medicine

## 2011-08-19 ENCOUNTER — Other Ambulatory Visit: Payer: Self-pay | Admitting: Obstetrics and Gynecology

## 2011-08-19 ENCOUNTER — Ambulatory Visit
Admission: RE | Admit: 2011-08-19 | Discharge: 2011-08-19 | Disposition: A | Discharge status: Home / Self Care | Payer: BC Managed Care – PPO | Source: Ambulatory Visit | Attending: Obstetrics and Gynecology | Admitting: Obstetrics and Gynecology

## 2011-08-19 DIAGNOSIS — R1310 Dysphagia, unspecified: Secondary | ICD-10-CM

## 2011-08-19 MED ORDER — IOHEXOL 300 MG/ML  SOLN
75.0000 mL | Freq: Once | INTRAMUSCULAR | Status: AC | PRN
  Administered 2011-08-19: 75 mL via INTRAVENOUS

## 2011-08-26 ENCOUNTER — Ambulatory Visit (INDEPENDENT_AMBULATORY_CARE_PROVIDER_SITE_OTHER): Payer: BC Managed Care – PPO | Admitting: Internal Medicine

## 2011-08-26 VITALS — BP 100/60 | HR 81 | Temp 97.7°F | Resp 16 | Ht 62.0 in | Wt 131.0 lb

## 2011-08-26 DIAGNOSIS — R5381 Other malaise: Secondary | ICD-10-CM

## 2011-08-26 DIAGNOSIS — M545 Low back pain, unspecified: Secondary | ICD-10-CM

## 2011-08-26 DIAGNOSIS — R1310 Dysphagia, unspecified: Secondary | ICD-10-CM

## 2011-08-26 DIAGNOSIS — R6881 Early satiety: Secondary | ICD-10-CM

## 2011-08-26 DIAGNOSIS — N390 Urinary tract infection, site not specified: Secondary | ICD-10-CM

## 2011-08-26 DIAGNOSIS — R5383 Other fatigue: Secondary | ICD-10-CM

## 2011-08-26 DIAGNOSIS — R52 Pain, unspecified: Secondary | ICD-10-CM

## 2011-08-26 LAB — POCT UA - MICROSCOPIC ONLY
Bacteria, U Microscopic: NEGATIVE
Crystals, Ur, HPF, POC: NEGATIVE
Epithelial cells, urine per micros: NEGATIVE
RBC, urine, microscopic: NEGATIVE
WBC, Ur, HPF, POC: NEGATIVE

## 2011-08-26 LAB — POCT CBC
Granulocyte percent: 54.8 %G (ref 37–80)
MCV: 94.2 fL (ref 80–97)
MID (cbc): 0.3 (ref 0–0.9)
MPV: 10.2 fL (ref 0–99.8)
POC LYMPH PERCENT: 39.6 %L (ref 10–50)
POC MID %: 5.6 %M (ref 0–12)
Platelet Count, POC: 232 10*3/uL (ref 142–424)
RBC: 5.09 M/uL (ref 4.04–5.48)
RDW, POC: 12.8 %

## 2011-08-26 LAB — POCT URINALYSIS DIPSTICK
Bilirubin, UA: NEGATIVE
Blood, UA: NEGATIVE
Ketones, UA: NEGATIVE
Protein, UA: NEGATIVE
Spec Grav, UA: 1.02
pH, UA: 6

## 2011-08-26 MED ORDER — CILIDINIUM-CHLORDIAZEPOXIDE 2.5-5 MG PO CAPS: 1.0000 | ORAL_CAPSULE | Freq: Three times a day (TID) | ORAL | Status: DC

## 2011-08-26 MED ORDER — POLYETHYLENE GLYCOL 3350 17 GM/SCOOP PO POWD: 17.0000 g | Freq: Every day | ORAL | Status: AC

## 2011-08-26 NOTE — Progress Notes (Signed)
Subjective:    Patient ID: Alice Cruz, female    DOB: [birth year], 56 y.o.   MRN: [id number]  HPIHere for further evaluation of recent gastrointestinal problems and multiple systemic symptoms. She has been seen a physician [facility] and a PA have a lower GI about recent problems with dysphagia, poor appetite, early satiety, fluctuating diarrhea and constipation, low back pain, and recent fatigue sharp lumbar pains for weeks-thought to be UTI based but no resolution with current Antibiotics Dull pain often then Sharp with BMs or trying to urinate/no change with movement,lifting etc No recent const or diarrhea tho often constip w/ lexapro/ Had periumbil pain yesterday For the first time that pain has been associated with her symptoms  Labs at recent visit to [facility] urgent care revealed normal CBC, CMET, and TSH/Cholesterol was in the 280s with an LDL of 153  GI evaluation has included barium swallow or with CT of the neck and esophageal area-no cause of her symptoms has been discovered although pushing into the esophagus has a significant cervical Bulge from spondylosis pushing into the esophagus  They suggest an upper endoscopy next and tried to schedule her with Dr. Leone Payor but she had a bad experience with her colonoscopy and would prefer to see someone else    Review of Systems  Constitutional: Negative for fever, chills and unexpected weight change.  HENT: Negative for mouth sores, neck pain, neck stiffness, dental problem, voice change and postnasal drip.   Cardiovascular: Negative for chest pain, palpitations and leg swelling.       Recent EKG within normal limits  Genitourinary: Negative for dysuria, urgency, frequency, hematuria, decreased urine volume and difficulty urinating.       Her culture recently grew enterococcus and she was put on amoxicillin by [facility] but her symptoms at that time were 0 except for low back discomfort as described above/she  has a history of cystitis symptoms and is on medication from urology  Musculoskeletal: Negative for joint swelling, arthralgias and gait problem.  Skin: Negative for rash.  Neurological: Negative for dizziness, tremors and weakness.  Hematological: Does not bruise/bleed easily.  Psychiatric/Behavioral: Positive for dysphoric mood.       Feels like her current psychiatric medicines are helpful   She has discussed this dysphagia with Dr. Dub Mikes her psychiatrist who tells her he thinks it is not a psychiatric symptom     Objective:   Physical Exam  Constitutional: She is oriented to person, place, and time. She appears well-developed and well-nourished.  HENT:  Head: Normocephalic.  Eyes: Conjunctivae and EOM are normal. Pupils are equal, round, and reactive to light.  Neck: Normal range of motion. Neck supple. No thyromegaly present.  Cardiovascular: Normal rate and regular rhythm.   Pulmonary/Chest: Effort normal.  Abdominal: Soft. Bowel sounds are normal. She exhibits no mass. There is no tenderness.  Musculoskeletal: She exhibits no edema.       She is nontender over the lumbar spine and/there is no tenderness to percussion at the costovertebral angles/she is able to twist and forward flex without pain Straight leg raise is negative bilaterally Reflexes intact  Lymphadenopathy:    She has no cervical adenopathy.  Neurological: She is alert and oriented to person, place, and time.  Psychiatric: Judgment and thought content normal.       Obviously depressed at her current situation      Results for orders placed in visit on 08/26/11  POCT CBC  Component Value Range   WBC 5.7  4.6 - 10.2 K/uL   Lymph, poc 2.3  0.6 - 3.4   POC LYMPH PERCENT 39.6  10 - 50 %L   MID (cbc) 0.3  0 - 0.9   POC MID % 5.6  0 - 12 %M   POC Granulocyte 3.1  2 - 6.9   Granulocyte percent 54.8  37 - 80 %G   RBC 5.09  4.04 - 5.48 M/uL   Hemoglobin 14.7  12.2 - 16.2 g/dL   HCT, POC 16.1  09.6 - 47.9 %    MCV 94.2  80 - 97 fL   MCH, POC 28.9  27 - 31.2 pg   MCHC 30.7 (*) 31.8 - 35.4 g/dL   RDW, POC 04.5     Platelet Count, POC 232  142 - 424 K/uL   MPV 10.2  0 - 99.8 fL  .th  Assessment & Plan:   1. Lumbar pain -This does not seem to be musculoskeletal   2. Recurrent UTI -Normal urinalysis today POCT UA - Microscopic Only, POCT urinalysis dipstick  3. Fatigue  POCT CBC, Celiac panel  4. Dysphagia  Celiac panel  5. Early satiety  Celiac panel  Her multiple symptoms in the gastrointestinal area suggest the need to complete the workup with an endoscopy/have referred her to Dr. Matthias Hughs For now she is prescribed MiraLax for 2 weeks with Librax before meals and at bedtime to see if a chronic constipation etiology with or without irritable bowel symptoms could be the overall culprit Celiac antibodies have not been done at this point and would also complete the GI workup

## 2011-08-27 LAB — GLIA (IGA/G) + TTG IGA
Gliadin IgA: 1 U/mL (ref <20)
Gliadin IgG: 2.2 U/mL (ref <20)

## 2011-08-28 ENCOUNTER — Encounter: Payer: Self-pay | Admitting: Internal Medicine

## 2011-09-03 ENCOUNTER — Telehealth: Payer: Self-pay

## 2011-09-03 ENCOUNTER — Telehealth: Payer: Self-pay | Admitting: Internal Medicine

## 2011-09-03 DIAGNOSIS — M545 Low back pain, unspecified: Secondary | ICD-10-CM

## 2011-09-03 NOTE — Telephone Encounter (Signed)
Have left message for her to call me back and give more information so I can discuss with Dr Merla Riches

## 2011-09-03 NOTE — Telephone Encounter (Signed)
veeeery funny. he  happens to be my doctor and I am not scared of him. I will see her.

## 2011-09-03 NOTE — Telephone Encounter (Signed)
Dr. Merla Riches,    Patient states the medication is not working and she is no better.  She would like to talk with you regarding her treatment.   [phone]

## 2011-09-03 NOTE — Telephone Encounter (Signed)
Patient called back insisting someone speak with her about her symptoms--wanted a call back sooner.    States that her low back is hurting still despite using medication we advised, and has pain with start of urination.  Advised she would most likely need OV to recheck pain and urine symptoms, and if she did not want to wait she could RTC 1st thing in am.  If she feels pain is that severe, then should go to ER tonight.    Patient would still like to hear back from Dr. Merla Riches, so I advised I would route this message to his box.

## 2011-09-03 NOTE — Telephone Encounter (Signed)
Dr Brodie will you accept the patient ? 

## 2011-09-03 NOTE — Telephone Encounter (Signed)
Spoke with pt. She is taking Librax and miralax, she noticed the pain in her lower back came back. It is very painful and she cannot make it up here by 4 to see Dr Merla Riches. Please advise.

## 2011-09-04 NOTE — Telephone Encounter (Signed)
Patient is scheduled for office visit for 10/16/11 3:45

## 2011-09-04 NOTE — Telephone Encounter (Signed)
Complete urological evaluation and they felt that everything was normal No explanation for recurrent flank pain She has an appointment with Dr. Juanda Chance in November for GI followup The next logical step would be evaluation by her orthopedist to rule out musculoskeletal cause of this pain

## 2011-09-25 ENCOUNTER — Encounter: Payer: Self-pay | Admitting: *Deleted

## 2011-10-16 ENCOUNTER — Ambulatory Visit: Payer: BC Managed Care – PPO | Admitting: Internal Medicine

## 2012-05-07 ENCOUNTER — Encounter: Payer: Self-pay | Admitting: Neurology

## 2012-05-07 ENCOUNTER — Ambulatory Visit (INDEPENDENT_AMBULATORY_CARE_PROVIDER_SITE_OTHER): Payer: BC Managed Care – PPO | Admitting: Neurology

## 2012-05-07 VITALS — BP 105/70 | HR 84 | Wt 138.0 lb

## 2012-05-07 DIAGNOSIS — G43909 Migraine, unspecified, not intractable, without status migrainosus: Secondary | ICD-10-CM

## 2012-05-07 DIAGNOSIS — F32A Depression, unspecified: Secondary | ICD-10-CM

## 2012-05-07 DIAGNOSIS — F329 Major depressive disorder, single episode, unspecified: Secondary | ICD-10-CM

## 2012-05-07 DIAGNOSIS — G43719 Chronic migraine without aura, intractable, without status migrainosus: Secondary | ICD-10-CM | POA: Insufficient documentation

## 2012-05-07 DIAGNOSIS — E785 Hyperlipidemia, unspecified: Secondary | ICD-10-CM | POA: Insufficient documentation

## 2012-05-07 DIAGNOSIS — N393 Stress incontinence (female) (male): Secondary | ICD-10-CM

## 2012-05-07 DIAGNOSIS — M940 Chondrocostal junction syndrome [Tietze]: Secondary | ICD-10-CM

## 2012-05-07 DIAGNOSIS — F3289 Other specified depressive episodes: Secondary | ICD-10-CM

## 2012-05-07 DIAGNOSIS — K5730 Diverticulosis of large intestine without perforation or abscess without bleeding: Secondary | ICD-10-CM

## 2012-05-07 DIAGNOSIS — K621 Rectal polyp: Secondary | ICD-10-CM

## 2012-05-07 DIAGNOSIS — K219 Gastro-esophageal reflux disease without esophagitis: Secondary | ICD-10-CM | POA: Insufficient documentation

## 2012-05-07 MED ORDER — TOPIRAMATE 50 MG PO TABS: 50.0000 mg | ORAL_TABLET | Freq: Two times a day (BID) | ORAL | Status: DC

## 2012-05-07 MED ORDER — ELETRIPTAN HYDROBROMIDE 40 MG PO TABS: 40.0000 mg | ORAL_TABLET | ORAL | Status: DC | PRN

## 2012-05-07 NOTE — Progress Notes (Signed)
Alice Cruz is a 57 years old right-handed Caucasian female, referred by her primary care physician Dr. Pete Glatter, for evaluation of chronic migraine headaches  She had a past medical history of depression, Hispanic origin, works as a Clinical biochemist, she reported a history of migraine since 1971, at age 89, getting worse since 1978, frequent migraine headaches, had extensive evaluation during that period of time, nothing was found, including LP,  She started menopause around age 69, she began to have more intense, more frequent headaches ever since, to a daily basis for 8 years, she has tried Tylenol PM, Aleve twice a day, without helping,  She has been missing 4-5 days of work each month because of her headaches, depression, she was evaluated by [facility] headache specialist Dr. Renaldo Fiddler in May 2013, was started on Zonegran 150 mg every day initially was helping her some ,   She was also evaluated by headache specialist Dr. Neale Burly, who has suggested increasing dose of Zonegran to 200 mg every day, she reported increased headache  Over the years, she also tried amitriptyline, no help, gained 20 pounds, topiramate, difficulty focusing,  For her depression, she is also taking Lexapro 20 mg every day, temazepam 1 mg twice a day since 1995, continued to combating depression,  She described her migraine as bitemporal, bifrontal, retro-orbital area severe pounding headache, with associated light noise sensitivity, she had headache for 2 weeks, went to urgent care, was getting portal intramuscular injection, steroid, which has helped her headache, Phenergan as needed for nausea,  Previously she has tried Imitrex as abortive treatment, Maxalt, without much help, a  She complains difficulty concentrating,  Review of Systems  Out of a complete 14 system review, the patient complains of only the following symptoms, and all other reviewed systems are negative.   Constitutional:   Fatigue Cardiovascular: chest  pain due to costochondritis  Ear/Nose/Throat:  N/A Skin: N/A Eyes: N/A Respiratory: Cough Gastroitestinal: N/A    Hematology/Lymphatic:  N/A Endocrine:  N/A Musculoskeletal:N/A Allergy/Immunology: runny nose Neurological: Memory loss, headaches Psychiatric: Depression, anxiety, not enough sleep   PHYSICAL EXAMINATOINS:  Generalized: depressed-looking middle-age female       Neck: Supple, no carotid bruits   Cardiac: Regular rate rhythm  Pulmonary: Clear to auscultation bilaterally  Musculoskeletal: No deformity  Neurological examination  Mentation: Alert oriented to time, place, history taking, and causual conversation  Cranial nerve II-XII: Pupils were equal round reactive to light extraocular movements were full, visual field were full on confrontational test. facial sensation and strength were normal. hearing was intact to finger rubbing bilaterally. Uvula tongue midline.  head turning and shoulder shrug and were normal and symmetric.Tongue protrusion into cheek strength was normal.  Motor: normal tone, bulk and strength.  Sensory: Intact to fine touch, pinprick, preserved vibratory sensation, and proprioception at toes.  Coordination: Normal finger to nose, heel-to-shin bilaterally there was no truncal ataxia  Gait: Rising up from seated position without assistance, normal stance, without trunk ataxia, moderate stride, good arm swing, smooth turning, able to perform tiptoe, and heel walking without difficulty.   Romberg signs: Negative  Deep tendon reflexes: Brachioradialis 2/2, biceps 2/2, triceps 2/2, patellar 2/2, Achilles 2/2, plantar responses were flexor bilaterally.   Assessment and plan: 57 years old right-handed Hispanic female, with past medical history of depression anxiety, migraine headaches, now presenting with frequent headaches, daily basis, normal neurological examination previously has tried and failed Zonegran, amitriptyline, 1. Magnesium oxide  400 mg twice a day, riboflavin 100  twice a day  as preventive medications 2 Relpax as needed for abortive treatment 3. Avoiding daily Tylenol, Aleve use to prevent medicine rebound headaches 4 May consider Botox as preventive medications . 5.Return to clinic with [name] in 3 months  6 her depression likely play a major role in her complains of frequent headaches.

## 2012-05-12 ENCOUNTER — Ambulatory Visit: Payer: BC Managed Care – PPO | Admitting: Neurology

## 2012-05-20 ENCOUNTER — Other Ambulatory Visit: Payer: Self-pay | Admitting: Obstetrics and Gynecology

## 2012-06-02 ENCOUNTER — Telehealth: Payer: Self-pay | Admitting: Neurology

## 2012-06-02 NOTE — Telephone Encounter (Signed)
Patient called and was very upset, because her pharmacist was concerned that doctor prescribed relpax since she was on Lexapro.  She wanted to come in with her husband and discuss it with the doctor.  I told her she needed an appt. And instead suggested that she have the doctor call and explain it to them.  She said it was ok if the doctor would please call and speak to her husband at [phone].  She also wanted doctor to know that she couldn't take the topamax, but is taking the Gabapentin.  She also stated that when she took the Relpax it worked, but her husband doesn't want her taking it anymore.

## 2012-06-03 ENCOUNTER — Telehealth: Payer: Self-pay | Admitting: Neurology

## 2012-06-03 ENCOUNTER — Encounter: Payer: Self-pay | Admitting: Neurology

## 2012-06-03 ENCOUNTER — Ambulatory Visit (INDEPENDENT_AMBULATORY_CARE_PROVIDER_SITE_OTHER): Payer: BC Managed Care – PPO | Admitting: Neurology

## 2012-06-03 VITALS — BP 104/73 | HR 84 | Wt 140.0 lb

## 2012-06-03 DIAGNOSIS — G43909 Migraine, unspecified, not intractable, without status migrainosus: Secondary | ICD-10-CM

## 2012-06-03 NOTE — Telephone Encounter (Signed)
Patient has an appointment on 06-03-12 at 1530.

## 2012-06-03 NOTE — Telephone Encounter (Signed)
Please give her a follow up appt with her husband.

## 2012-06-03 NOTE — Telephone Encounter (Signed)
Patient has an appointment 06-03-12 at 1530 to discuss medications with Dr. Terrace Arabia.

## 2012-06-03 NOTE — Telephone Encounter (Signed)
Message copied by Elisha Headland on Wed Jun 03, 2012  3:17 PM ------      Message from: Warren Lacy A      Created: Tue Jun 02, 2012  3:28 PM      Contact: Alice Cruz called, said she is having migraines everyday and need to talk to someone about the medicine that was given to her, at her last visit. Please call her at [phone]                        Thanks ------

## 2012-06-03 NOTE — Telephone Encounter (Signed)
Message copied by Elisha Headland on Wed Jun 03, 2012  2:05 PM ------      Message from: Warren Lacy A      Created: Tue Jun 02, 2012  3:28 PM      Contact: Willowdean Luhmann called, said she is having migraines everyday and need to talk to someone about the medicine that was given to her, at her last visit. Please call her at [phone]                        Thanks ------

## 2012-06-03 NOTE — Progress Notes (Signed)
Alice Cruz is a 57 years old right-handed Caucasian female, referred by her primary care physician Dr. Pete Glatter, for evaluation of chronic migraine headaches  She had a past medical history of depression, Hispanic origin, works as a Clinical biochemist, she reported a history of migraine since 1971, at age 1, getting worse since 1978, frequent migraine headaches, had extensive evaluation during that period of time, nothing was found, including LP,  She started menopause around age 30, she began to have more intense, more frequent headaches ever since, to a daily basis for 8 years, she has tried Tylenol PM, Aleve twice a day, without helping,  She has been missing 4-5 days of work each month because of her headaches, depression, she was evaluated by [facility] headache specialist Dr. Renaldo Fiddler in May 2013, was started on Zonegran 150 mg every day initially was helping her some ,   She was also evaluated by headache specialist Dr. Neale Burly, who has suggested increasing dose of Zonegran to 200 mg every day, she reported increased headache  Over the years, she also tried amitriptyline, no help, gained 20 pounds, topiramate, difficulty focusing,  For her depression, she is also taking Lexapro 20 mg every day for one year, temazepam 1 mg twice a day since 1995, continued to combating depression,  She described her migraine as bitemporal, bifrontal, retro-orbital area severe pounding headache, with associated light noise sensitivity, she had headache for 2 weeks, went to urgent care, was getting portal intramuscular injection, steroid, which has helped her headache, Phenergan as needed for nausea,  Previously she has tried Imitrex as abortive treatment, Maxalt, without much help.  She complains difficulty concentrating,  UPDATE April 30th 2014:  Relpax works very well for her, it took about 90-120 minutes for her headaches to go away, but she has hang over, it stopped her headaches immediately.    She tried Relpax  3 tabs, April 27, April 21, 25, slept most of the day afterwards, she came in earlier today with her husband, concerning the potential  combination side effect from Lexapro, and Relpax, I have explained to her and her husband, she did very well on Relpax, she is on stable dose of Lexapro, should be ok.  She was started on Neurontin 200 mg 3 times a day, complaining lightheadedness, blurry vision,  Review of Systems  Out of a complete 14 system review, the patient complains of only the following symptoms, and all other reviewed systems are negative.   Constitutional:   Fatigue Cardiovascular: chest pain due to costochondritis  Ear/Nose/Throat:  N/A Skin: N/A Eyes: N/A Respiratory: Cough Gastroitestinal: N/A    Hematology/Lymphatic:  N/A Endocrine:  N/A Musculoskeletal:N/A Allergy/Immunology: runny nose Neurological: Memory loss, headaches Psychiatric: Depression, anxiety, not enough sleep   PHYSICAL EXAMINATOINS:  Generalized: depressed-looking middle-age female       Neck: Supple, no carotid bruits   Cardiac: Regular rate rhythm  Pulmonary: Clear to auscultation bilaterally  Musculoskeletal: No deformity  Neurological examination  Mentation: Alert oriented to time, place, history taking, and causual conversation  Cranial nerve II-XII: Pupils were equal round reactive to light extraocular movements were full, visual field were full on confrontational test. facial sensation and strength were normal. hearing was intact to finger rubbing bilaterally. Uvula tongue midline.  head turning and shoulder shrug and were normal and symmetric.Tongue protrusion into cheek strength was normal.  Motor: normal tone, bulk and strength.  Sensory: Intact to fine touch, pinprick, preserved vibratory sensation, and proprioception at toes.  Coordination: Normal finger to nose,  heel-to-shin bilaterally there was no truncal ataxia  Gait: Rising up from seated position without assistance, normal  stance, without trunk ataxia, moderate stride, good arm swing, smooth turning, able to perform tiptoe, and heel walking without difficulty.   Romberg signs: Negative  Deep tendon reflexes: Brachioradialis 2/2, biceps 2/2, triceps 2/2, patellar 2/2, Achilles 2/2, plantar responses were flexor bilaterally.   Assessment and plan: 58 years old right-handed Hispanic female, with past medical history of depression anxiety, migraine headaches, now presenting with frequent headaches, daily basis, normal neurological examination previously has tried and failed Zonegran, amitriptyline, 1. Magnesium oxide 400 mg twice a day, riboflavin 100  twice a day as preventive medications 2 Relpax as needed for abortive treatment 3. Avoiding daily Tylenol, Aleve use to prevent medicine rebound headaches 4 .Return to clinic with [name] in 3 months

## 2012-06-16 ENCOUNTER — Telehealth (HOSPITAL_COMMUNITY): Payer: Self-pay | Admitting: Emergency Medicine

## 2012-06-16 ENCOUNTER — Encounter (HOSPITAL_COMMUNITY): Payer: Self-pay | Admitting: Family Medicine

## 2012-06-16 ENCOUNTER — Emergency Department (HOSPITAL_COMMUNITY)
Admission: EM | Admit: 2012-06-16 | Discharge: 2012-06-16 | Disposition: A | Discharge status: Home / Self Care | Payer: BC Managed Care – PPO | Attending: Emergency Medicine | Admitting: Emergency Medicine

## 2012-06-16 DIAGNOSIS — Z8742 Personal history of other diseases of the female genital tract: Secondary | ICD-10-CM | POA: Insufficient documentation

## 2012-06-16 DIAGNOSIS — J3489 Other specified disorders of nose and nasal sinuses: Secondary | ICD-10-CM | POA: Insufficient documentation

## 2012-06-16 DIAGNOSIS — R05 Cough: Secondary | ICD-10-CM | POA: Insufficient documentation

## 2012-06-16 DIAGNOSIS — Z8639 Personal history of other endocrine, nutritional and metabolic disease: Secondary | ICD-10-CM | POA: Insufficient documentation

## 2012-06-16 DIAGNOSIS — R059 Cough, unspecified: Secondary | ICD-10-CM | POA: Insufficient documentation

## 2012-06-16 DIAGNOSIS — Z79899 Other long term (current) drug therapy: Secondary | ICD-10-CM | POA: Insufficient documentation

## 2012-06-16 DIAGNOSIS — Z8719 Personal history of other diseases of the digestive system: Secondary | ICD-10-CM | POA: Insufficient documentation

## 2012-06-16 DIAGNOSIS — H6690 Otitis media, unspecified, unspecified ear: Secondary | ICD-10-CM | POA: Insufficient documentation

## 2012-06-16 DIAGNOSIS — F3289 Other specified depressive episodes: Secondary | ICD-10-CM | POA: Insufficient documentation

## 2012-06-16 DIAGNOSIS — G43909 Migraine, unspecified, not intractable, without status migrainosus: Secondary | ICD-10-CM | POA: Insufficient documentation

## 2012-06-16 DIAGNOSIS — Z8739 Personal history of other diseases of the musculoskeletal system and connective tissue: Secondary | ICD-10-CM | POA: Insufficient documentation

## 2012-06-16 DIAGNOSIS — Z862 Personal history of diseases of the blood and blood-forming organs and certain disorders involving the immune mechanism: Secondary | ICD-10-CM | POA: Insufficient documentation

## 2012-06-16 DIAGNOSIS — Z860100 Personal history of colon polyps, unspecified: Secondary | ICD-10-CM | POA: Insufficient documentation

## 2012-06-16 DIAGNOSIS — J029 Acute pharyngitis, unspecified: Secondary | ICD-10-CM | POA: Insufficient documentation

## 2012-06-16 DIAGNOSIS — R6889 Other general symptoms and signs: Secondary | ICD-10-CM | POA: Insufficient documentation

## 2012-06-16 DIAGNOSIS — H6692 Otitis media, unspecified, left ear: Secondary | ICD-10-CM

## 2012-06-16 DIAGNOSIS — F329 Major depressive disorder, single episode, unspecified: Secondary | ICD-10-CM | POA: Insufficient documentation

## 2012-06-16 MED ORDER — ANTIPYRINE-BENZOCAINE 5.4-1.4 % OT SOLN
3.0000 [drp] | OTIC | Status: DC | PRN
  Administered 2012-06-16: 4 [drp] via OTIC
  Filled 2012-06-16: qty 10

## 2012-06-16 MED ORDER — AMOXICILLIN 875 MG PO TABS: 875.0000 mg | ORAL_TABLET | Freq: Three times a day (TID) | ORAL | Status: DC

## 2012-06-16 MED ORDER — HYDROCOD POLST-CHLORPHEN POLST 10-8 MG/5ML PO LQCR: 5.0000 mL | Freq: Two times a day (BID) | ORAL | Status: DC | PRN

## 2012-06-16 MED ORDER — AMOXICILLIN 500 MG PO CAPS
1000.0000 mg | ORAL_CAPSULE | Freq: Once | ORAL | Status: AC
  Administered 2012-06-16: 1000 mg via ORAL
  Filled 2012-06-16: qty 2

## 2012-06-16 MED ORDER — OXYMETAZOLINE HCL 0.05 % NA SOLN
3.0000 | Freq: Two times a day (BID) | NASAL | Status: DC | PRN
  Filled 2012-06-16: qty 15

## 2012-06-16 NOTE — ED Provider Notes (Signed)
History     CSN: [id number]  Arrival date & time 06/16/12  6213   First MD Initiated Contact with Patient 06/16/12 0550      Chief Complaint  Patient presents with  . Sore Throat and Earache     (Consider location/radiation/quality/duration/timing/severity/associated sxs/prior treatment) HPI This is a 57 year old female with about a 36 hour history of cold symptoms. Specifically she is complaining of nasal congestion, scratchy throat, nonproductive cough, sneezing and rhinorrhea. During that time she has developed moderate to severe pain in her ears worse on the left than the right. Her left ear feels "stuffed up" with decreased hearing acuity. She states her right ear has been "popping". There has been no drainage from the ear. She's been taking Mucinex allergy and Mucinex cough without relief. She denies a fever. She denies nausea, vomiting or diarrhea.  Past Medical History  Diagnosis Date  . Migraines   . Depression   . Stress incontinence, female   . Sigmoid diverticulosis     severe & with angulated sigmoid  . HLD (hyperlipidemia)   . Costochondritis   . Hyperplastic rectal polyp     3mm  . GERD (gastroesophageal reflux disease)   . Diverticulosis     Past Surgical History  Procedure Laterality Date  . Cesarean section      x 2  . Abdominal hysterectomy  1994  . Breast surgery  2012    breast reduction  . Umbilical hernia repair  1955  . Colonoscopy  2008    Family History  Problem Relation Age of Onset  . Diabetes Maternal Aunt   . Diabetes Maternal Uncle   . Diabetes Maternal Grandmother   . Heart disease Paternal Grandmother   . Colon cancer    . Liver disease Father     History  Substance Use Topics  . Smoking status: Never Smoker   . Smokeless tobacco: Never Used  . Alcohol Use: 0.0 oz/week     Comment: social    OB History   Grav Para Term Preterm Abortions TAB SAB Ect Mult Living                  Review of Systems  All other systems  reviewed and are negative.    Allergies  Viibryd  Home Medications   Current Outpatient Rx  Name  Route  Sig  Dispense  Refill  . B Complex Vitamins (B COMPLEX PO)   Oral   Take by mouth as directed.         . clonazePAM (KLONOPIN) 1 MG tablet   Oral   Take 1 mg by mouth 2 (two) times daily as needed.         . diphenoxylate-atropine (LOMOTIL) 2.5-0.025 MG per tablet      as needed. Take 1 tab twice daily.         Marland Kitchen eletriptan (RELPAX) 40 MG tablet   Oral   One tablet by mouth at onset of headache. May repeat in 2 hours if headache persists or recurs. may repeat in 2 hours if necessary   10 tablet   6   . escitalopram (LEXAPRO) 10 MG tablet   Oral   Take 20 mg by mouth daily.          Marland Kitchen gabapentin (NEURONTIN) 600 MG tablet   Oral   Take 600 mg by mouth 3 (three) times daily.           BP 94/55  Temp(Src) 98.2 F (  36.8 C) (Oral)  Resp 16  Ht 5\' 1"  (1.549 m)  Wt 140 lb (63.504 kg)  BMI 26.47 kg/m2  SpO2 99%  Physical Exam General: Well-developed, well-nourished female in no acute distress; appearance consistent with age of record HENT: normocephalic, atraumatic; right TM normal, left TM bulging and erythematous; nasal congestion; no pharyngeal erythema or exudate; mild dysphonia with nasal quality to voice Eyes: pupils equal round and reactive to light; extraocular muscles intact Neck: supple Heart: regular rate and rhythm Lungs: clear to auscultation bilaterally Abdomen: soft; nondistended; nontender; bowel sounds present Extremities: No deformity; full range of motion; pulses normal Neurologic: Awake, alert; motor function intact in all extremities and symmetric; no facial droop Skin: Warm and dry Psychiatric: Normal mood and affect    ED Course  Procedures (including critical care time)    MDM  We'll treat for acute otitis media. Patient was advised to use an over-the-counter nasal decongestant to aid drainage of her eustachian  tubes.        Hanley Seamen, MD 06/16/12 [phone]

## 2012-06-16 NOTE — ED Notes (Signed)
Patient states that she has ear pain and pressure as well as a sore throat. States pressure worse on left. States symptoms have persisted for the past 18 hours. Has tried Mucinex Allergy and Mucinex Cough with mininal relief. Denies fever.

## 2012-06-16 NOTE — ED Notes (Signed)
Pharmacy calling for clarification of Instructions for Amoxicillin.

## 2012-08-12 ENCOUNTER — Ambulatory Visit (INDEPENDENT_AMBULATORY_CARE_PROVIDER_SITE_OTHER): Payer: BC Managed Care – PPO | Admitting: Nurse Practitioner

## 2012-08-12 ENCOUNTER — Encounter: Payer: Self-pay | Admitting: Nurse Practitioner

## 2012-08-12 VITALS — BP 103/68 | HR 86 | Ht 62.0 in | Wt 142.0 lb

## 2012-08-12 DIAGNOSIS — G43909 Migraine, unspecified, not intractable, without status migrainosus: Secondary | ICD-10-CM

## 2012-08-12 MED ORDER — PREDNISONE 10 MG PO TABS: ORAL_TABLET | ORAL | Status: DC

## 2012-08-12 MED ORDER — PROMETHAZINE HCL 25 MG PO TABS: 25.0000 mg | ORAL_TABLET | Freq: Four times a day (QID) | ORAL | Status: DC | PRN

## 2012-08-12 NOTE — Patient Instructions (Addendum)
Given information on common migraine triggers Avoid over-the-counter preparations Will give prednisone Dosepak to break this daily headache Will attempt to get her PA for additional Relpax Will refill Phenergan for nausea Followup in 3 months
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
## 2012-09-04 ENCOUNTER — Ambulatory Visit
Admission: RE | Admit: 2012-09-04 | Discharge: 2012-09-04 | Disposition: A | Discharge status: Home / Self Care | Payer: BC Managed Care – PPO | Source: Ambulatory Visit | Attending: Geriatric Medicine | Admitting: Geriatric Medicine

## 2012-09-04 ENCOUNTER — Other Ambulatory Visit: Payer: Self-pay | Admitting: Geriatric Medicine

## 2012-09-04 DIAGNOSIS — R1031 Right lower quadrant pain: Secondary | ICD-10-CM

## 2012-09-04 MED ORDER — IOHEXOL 300 MG/ML  SOLN
30.0000 mL | Freq: Once | INTRAMUSCULAR | Status: AC | PRN
  Administered 2012-09-04: 30 mL via ORAL

## 2012-09-04 MED ORDER — IOHEXOL 300 MG/ML  SOLN
100.0000 mL | Freq: Once | INTRAMUSCULAR | Status: AC | PRN
  Administered 2012-09-04: 100 mL via INTRAVENOUS

## 2012-10-23 ENCOUNTER — Ambulatory Visit
Admission: RE | Admit: 2012-10-23 | Discharge: 2012-10-23 | Disposition: A | Discharge status: Home / Self Care | Payer: BC Managed Care – PPO | Source: Ambulatory Visit | Attending: Nurse Practitioner | Admitting: Nurse Practitioner

## 2012-10-23 ENCOUNTER — Other Ambulatory Visit: Payer: Self-pay | Admitting: Nurse Practitioner

## 2012-10-23 DIAGNOSIS — R109 Unspecified abdominal pain: Secondary | ICD-10-CM

## 2012-10-23 MED ORDER — IOHEXOL 300 MG/ML  SOLN
100.0000 mL | Freq: Once | INTRAMUSCULAR | Status: AC | PRN
  Administered 2012-10-23: 100 mL via INTRAVENOUS

## 2012-10-23 MED ORDER — IOHEXOL 300 MG/ML  SOLN
30.0000 mL | Freq: Once | INTRAMUSCULAR | Status: AC | PRN
  Administered 2012-10-23: 30 mL via ORAL

## 2013-07-16 ENCOUNTER — Other Ambulatory Visit: Payer: Self-pay | Admitting: Neurology

## 2013-09-06 ENCOUNTER — Other Ambulatory Visit: Payer: Self-pay | Admitting: Neurology

## 2013-09-07 ENCOUNTER — Encounter: Payer: Self-pay | Admitting: Internal Medicine

## 2013-10-12 ENCOUNTER — Encounter (INDEPENDENT_AMBULATORY_CARE_PROVIDER_SITE_OTHER): Payer: Self-pay

## 2013-10-12 ENCOUNTER — Ambulatory Visit (INDEPENDENT_AMBULATORY_CARE_PROVIDER_SITE_OTHER): Payer: BC Managed Care – PPO | Admitting: Adult Health

## 2013-10-12 ENCOUNTER — Ambulatory Visit (INDEPENDENT_AMBULATORY_CARE_PROVIDER_SITE_OTHER): Payer: BC Managed Care – PPO | Admitting: *Deleted

## 2013-10-12 ENCOUNTER — Encounter: Payer: Self-pay | Admitting: Adult Health

## 2013-10-12 ENCOUNTER — Telehealth: Payer: Self-pay | Admitting: Neurology

## 2013-10-12 VITALS — BP 102/69 | HR 75 | Resp 16

## 2013-10-12 VITALS — BP 114/74 | HR 76 | Ht 61.0 in | Wt 144.0 lb

## 2013-10-12 DIAGNOSIS — G43019 Migraine without aura, intractable, without status migrainosus: Secondary | ICD-10-CM

## 2013-10-12 MED ORDER — VALPROATE SODIUM 500 MG/5ML IV SOLN
500.0000 mg | INTRAVENOUS | Status: DC
  Administered 2013-10-12: 500 mg via INTRAVENOUS

## 2013-10-12 MED ORDER — PROCHLORPERAZINE EDISYLATE 5 MG/ML IJ SOLN
10.0000 mg | Freq: Once | INTRAMUSCULAR | Status: AC
  Administered 2013-10-12: 10 mg via INTRAVENOUS

## 2013-10-12 NOTE — Progress Notes (Signed)
Pt here for appt.   Order for Depacon  IV , may repeat if needed.  Compazine  IVP.  Give compazine first then depacon.  Pt has driver, son, Madelaine Bhat to pick her up.  Pt made comfortable in recliner. Instructed on plan. Allergies: Viibryd.  Has had hysterectomy and L ovary removed. Under aseptic technique 24g angiocath inserted to R AC with good blood return. Her migraine is level 9, around eyes.   0.9% NS /161ml started at 1552 infusing well.  Compazine  IVP 1552 tolerating well.  At 1603 depacon  IV/ 0.9%NS/ started.  Blanket applied and lights dimmed.  Offered water to drink, she verbalized yes.  VS 102/69, HR 75, pulse ox 97.  At 1620 pt stated she is sleepy/ groggy.  Could not really give me level of pain. She stated that she did not have pain.  Normal Saline 20ml flush given.  Butch Penny to room, brought information to her about botox.  Level one.  IV discontinued, pressure and bandage applied to R AC.  Pt stated ok to walk, I accompanied her, she was slightly groggy.  Adam, her son in waiting area, I got him for her.  Both in check out.
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
## 2013-10-12 NOTE — Patient Instructions (Signed)
Pt to call back as needed.  

## 2013-10-12 NOTE — Telephone Encounter (Signed)
I have called patient, she has been complaining headaches for 10 days, tried Relpax without helping,  I have advised her come in today, to be evaluated by my nurse practitioner, potential IV infusion, or subcutaneous triptan if needed, last clinical visit was with [name] in July 2014

## 2013-10-12 NOTE — Telephone Encounter (Signed)
Patient calling with a terrible migraine that she has had for over a week.  She has taken Relpax but it hasn't helped.  Please call and advise.  She can be reached at [phone].

## 2013-10-12 NOTE — Addendum Note (Signed)
Addended by: Enedina Finner on: 10/12/2013 04:26 PM   Modules accepted: Orders

## 2013-10-12 NOTE — Patient Instructions (Signed)
Valproic Acid, Divalproex Sodium injection What is this medicine? VALPROATE SODIUM (val PRO ate SO dee um) is used to treat certain types of seizures in patients with epilepsy. This medicine may be used for other purposes; ask your health care provider or pharmacist if you have questions. COMMON BRAND NAME(S): Depacon What should I tell my health care provider before I take this medicine? They need to know if you have any of these conditions: -blood disease -brain damage or disease -kidney disease -liver disease -low blood proteins -mitochondrial disease -suicidal thoughts, plans, or attempt; a previous suicide attempt by you or a family member -urea cycle disorder (UCD) -an unusual or allergic reaction to divalproex sodium, other medicines, foods, dyes, or preservatives -pregnant or trying to get pregnant -breast-feeding How should I use this medicine? This medicine is for infusion into a vein. It is given by a health care professional in a hospital or clinic setting. Talk to your pediatrician regarding the use of this medicine in children. Special care may be needed. Overdosage: If you think you have taken too much of this medicine contact a poison control center or emergency room at once. NOTE: This medicine is only for you. Do not share this medicine with others. What if I miss a dose? This does not apply. What may interact with this medicine? -aspirin -barbiturates, like phenobarbital -diazepam -isoniazid -medicines for depression, anxiety, or psychotic disturbances -medicines that treat or prevent blood clots like warfarin -meropenem -other seizure medicines -rifampin -tolbutamide -zidovudine This list may not describe all possible interactions. Give your health care provider a list of all the medicines, herbs, non-prescription drugs, or dietary supplements you use. Also tell them if you smoke, drink alcohol, or use illegal drugs. Some items may interact with your  medicine. What should I watch for while using this medicine? Your condition will be monitored carefully while you are receiving this medicine. You may get drowsy, dizzy, or have blurred vision. Do not drive, use machinery, or do anything that needs mental alertness until you know how this medicine affects you. To reduce dizzy or fainting spells, do not sit or stand up quickly, especially if you are an older patient. Alcohol can increase drowsiness and dizziness. Avoid alcoholic drinks. This medicine can cause blood problems. This can mean slow healing and a risk of infection. Problems can arise if you need dental work, and in the day to day care of your teeth. Try to avoid damage to your teeth and gums when you brush or floss your teeth. This medicine can make you more sensitive to the sun. Keep out of the sun. If you cannot avoid being in the sun, wear protective clothing and use sunscreen. Do not use sun lamps or tanning beds/booths. The use of this medicine may increase the chance of suicidal thoughts or actions. Pay special attention to how you are responding while on this medicine. Any worsening of mood, or thoughts of suicide or dying should be reported to your health care professional right away. What side effects may I notice from receiving this medicine? Side effects that you should report to your doctor or health care professional as soon as possible: -allergic reactions like skin rash, itching or hives, swelling of the face, lips, or tongue -changes in vision -changes in the frequency or severity of seizures -nausea, vomiting -redness, blistering, peeling or loosening of the skin, including inside the mouth -stomach pain or cramps -trembling of hands or arms -unusual bleeding or bruising or pinpoint red spots  on the skin -unusual swelling of the arms or legs -unusually weak or tired -worsening of mood, thoughts or actions of suicide or dying -yellowing of the eyes or skin Side effects  that usually do not require medical attention (report to your doctor or health care professional if they continue or are bothersome): -change in menstrual cycle -diarrhea or constipation -headache -loss of bladder control -loss of hair or unusual growth of hair -loss or increase in appetite -weight gain or loss This list may not describe all possible side effects. Call your doctor for medical advice about side effects. You may report side effects to FDA at 1-800-FDA-1088. Where should I keep my medicine? This drug is given in a hospital or clinic and will not be stored at home. NOTE: This sheet is a summary. It may not cover all possible information. If you have questions about this medicine, talk to your doctor, pharmacist, or health care provider.  2015, Elsevier/Gold Standard. (2011-09-17 11:53:19)

## 2013-10-14 ENCOUNTER — Ambulatory Visit (INDEPENDENT_AMBULATORY_CARE_PROVIDER_SITE_OTHER): Payer: BC Managed Care – PPO

## 2013-10-14 DIAGNOSIS — G43019 Migraine without aura, intractable, without status migrainosus: Secondary | ICD-10-CM

## 2013-10-18 ENCOUNTER — Telehealth: Payer: Self-pay | Admitting: Adult Health

## 2013-10-18 MED ORDER — GADOPENTETATE DIMEGLUMINE 469.01 MG/ML IV SOLN: 13.0000 mL | Freq: Once | INTRAVENOUS | Status: AC | PRN

## 2013-10-18 NOTE — Telephone Encounter (Signed)
Megan, please advise, I did not see the MRI results.

## 2013-10-18 NOTE — Telephone Encounter (Signed)
Patient calling regarding MRI results.  Also stated infusion didn't work, which was done on 09/08.  Patient saw Deboraha Sprang walk in clinic due to so much pain yesterday.  Prescribed Prednisone and Promethazine 25 mg.  Please call anytime and may leave detailed message if not available.

## 2013-10-18 NOTE — Telephone Encounter (Signed)
I called the patient. Her MRI results are not yet available to me. She continues to have a headache. States that the Depacon infusion worked initially but her headache came back. She went to a walk in clinic and was given a prednisone dosepak. Today was her first day taking it. I have encouraged her to finish it and then let us know how her headache is. Patient verbalized understanding.

## 2013-10-19 ENCOUNTER — Telehealth: Payer: Self-pay | Admitting: Adult Health

## 2013-10-19 NOTE — Telephone Encounter (Signed)
I called the patient regarding her MRI results. I left a voicemail for her to call our office.

## 2013-10-19 NOTE — Telephone Encounter (Signed)
I called the patient and gave her the MRI results. Her scan was mildly abnormal due to a single left frontal, periventricular focus of gliosis. We will just monitor this over time. She verbalized understanding. She would like to do an MRA to make sure she does not have an aneurysm or blood vessel narrowing that would be causing her continuous headache. I am not sure that is warranted at this time but I will consult with Dr. Terrace Arabia. Patient is currently taking prednisone and states that today is the first day she has been without a headache in 14 days.   MRI Brain:   IMPRESSION:  Mildly abnormal MRI brain (with and without) demonstrating: 1. There is a single left frontal, periventricular focus of gliosis (<98mm). These findings are non-specific and considerations include autoimmune, inflammatory, post-infectious, microvascular ischemic or migraine associated etiologies.  2. No acute findings. No abnormal enhancing lesions

## 2013-10-19 NOTE — Telephone Encounter (Signed)
Patient returning Megan's call regarding results, please call and advise.

## 2013-10-22 ENCOUNTER — Telehealth: Payer: Self-pay | Admitting: Adult Health

## 2013-10-22 DIAGNOSIS — R51 Headache: Secondary | ICD-10-CM

## 2013-10-22 NOTE — Telephone Encounter (Signed)
Patient returning Megan's call regarding MRI results, please return call and advise.

## 2013-10-25 ENCOUNTER — Telehealth: Payer: Self-pay | Admitting: Adult Health

## 2013-10-25 NOTE — Telephone Encounter (Signed)
I called the patient. Explained that I had ordered MRA of the brain. She states that she was called by our office in regards to sitting up her botox injections. However she would like to see another physician in our office. I will speak to my office manager regarding changing physicians.

## 2013-10-25 NOTE — Telephone Encounter (Signed)
Patient returning call to [facility] regarding results, patient also received a call from somebody regarding scheduling a shot and she has no idea about that, please return call and advise.

## 2013-10-25 NOTE — Telephone Encounter (Signed)
I called the patient. She has decided to not switch physicians. She will call Denese Killings and get her Botox injections scheduled.

## 2013-10-25 NOTE — Telephone Encounter (Signed)
I tried returning the patient's call. I left a voice message for her to call the office. I did speak with Dr. Terrace Arabia regarding the MRA of the brain. We will go ahead and order this for this patient.

## 2013-10-29 ENCOUNTER — Telehealth: Payer: Self-pay | Admitting: Neurology

## 2013-10-29 NOTE — Telephone Encounter (Signed)
States can have a shot for her migraine

## 2013-10-29 NOTE — Telephone Encounter (Addendum)
I called the patient. She states she has had a bad headache all day. She took the Relpax this morning and it took about 4 hours to work. She states that her headache has improved since this morning, although she continue to have pain in the back of her head. She states that she is going to continue to rest and if it's no better on Monday she will call us. She is scheduled for Botox in October and an MRA next week. She had tried multiple preventative Medications with no relief. She has had a Depacon infusion in the past for an acute migraine that was unsuccessful. Patient will call us on Monday if her headache as not improved.

## 2013-10-29 NOTE — Telephone Encounter (Signed)
Patient returning call, please call her back and advise.  °

## 2013-10-29 NOTE — Telephone Encounter (Signed)
I called the patient but got no answer. I have asked her to give [place] a call back at our office.

## 2013-10-29 NOTE — Telephone Encounter (Signed)
Called patient And spoke to patient she has a bad migraine she has taking a rep lax and its not helping . Patient is in bad and wants something for pain. Patient states her migraine is a 10.

## 2013-11-03 ENCOUNTER — Ambulatory Visit (INDEPENDENT_AMBULATORY_CARE_PROVIDER_SITE_OTHER): Payer: BC Managed Care – PPO | Admitting: Neurology

## 2013-11-03 ENCOUNTER — Encounter: Payer: Self-pay | Admitting: Neurology

## 2013-11-03 DIAGNOSIS — G43719 Chronic migraine without aura, intractable, without status migrainosus: Secondary | ICD-10-CM

## 2013-11-03 DIAGNOSIS — G43011 Migraine without aura, intractable, with status migrainosus: Secondary | ICD-10-CM

## 2013-11-03 MED ORDER — DICLOFENAC POTASSIUM(MIGRAINE) 50 MG PO PACK: 50.0000 mg | PACK | ORAL | Status: DC | PRN

## 2013-11-03 NOTE — Progress Notes (Addendum)
PATIENT: Alice Cruz DOB: 1955-10-01  HISTORY OF PRESENT ILLNESS: Alice Cruz is a 58 year old female with a history of migraines. Accompanied by her husband, for Botox injection as migraine prevention  She had Sabella history of chronic migraine headaches.also with past medical history of depression, works as a Clinical biochemist, she reported a history of migraine since 1971, at age 57, getting worse since 1978, frequent migraine headaches, had extensive evaluation during that period of time, "nothing was found", including  normal spinal tap    She started menopause around age 71, she began to have more intense, more frequent headaches to a daily basis  since 2005,  she has tried Tylenol PM, Aleve twice a day, without helping,   She has been missing 4-5 days of work each month because of her headaches, depression, she was evaluated by [facility] headache specialist Dr. Renaldo Fiddler in May 2013, was started on Zonegran 150 mg every day initially was helping her some ,   She was also evaluated by headache specialist Dr. Neale Burly, who has suggested increasing dose of Zonegran to 200 mg every day, she reported increased headache.  Over the years, she also tried different preventative medications,  amitriptyline, no help, gained 20 pounds, topiramate, difficulty focusing,  Depakote does not help her headache much  For her depression, she is also taking Lexapro 20 mg every day for one year, temazepam 1 mg twice a day since 1995, continued to combating depression,   She described her migraine as bitemporal, bifrontal, retro-orbital area severe pounding headache, with associated light noise sensitivity,  She has failed Imitrex and Maxalt in the past.  Relpax seems to work fine most of the time,  She has been complaining frequent headaches, daily basis, severe, lasting one to to 3 days since early September, 20/15 tried different measures to break the cycle, including IV Depacon infusion, steroids tapering  dose twice, Toradol injection, without sustained benefit,     Today she coming complains of mild to moderate bifrontal pressure pounding headaches,4/10, she denies visual change, no lateralized motor deficit deficit, no gait difficulty, she has long-standing history of urinary urgency, taking VESIcare  We have reviewed MRI of the brain, single isolated Left frontal subcortical gliosis,                                                                                                                                   REVIEW OF SYSTEMS: Full 14 system review of systems performed and notable only for:  As above    ALLERGIES: Allergies  Allergen Reactions  . Viibryd [Vilazodone Hcl] Diarrhea  . Codeine Itching    HOME MEDICATIONS: Outpatient Prescriptions Prior to Visit  Medication Sig Dispense Refill  . atorvastatin (LIPITOR) 10 MG tablet Take 10 mg by mouth daily.      . B Complex Vitamins (B COMPLEX PO) Take by mouth as directed.      Marland Kitchen  Calcium Carbonate-Vitamin D (CALTRATE 600+D) 600-400 MG-UNIT per chew tablet Chew 1 tablet by mouth daily.      . clonazePAM (KLONOPIN) 1 MG tablet Take 2 mg by mouth 2 (two) times daily as needed.       Marland Kitchen escitalopram (LEXAPRO) 20 MG tablet Take 20 mg by mouth daily.      . RELPAX 40 MG tablet TAKE 1 TABLET AT ONSET OF HEADACHE. MAY REPEAT IN 2 HRS IF NEEDED  8 tablet  0  . solifenacin (VESICARE) 10 MG tablet Take 10 mg by mouth daily.      . Vitamin D, Ergocalciferol, (DRISDOL) 50000 UNITS CAPS capsule Take 50,000 Units by mouth every 7 (seven) days.      Marland Kitchen valproate (DEPACON) 500 mg in sodium chloride 0.9 % 100 mL IVPB        No facility-administered medications prior to visit.    PAST MEDICAL HISTORY: Past Medical History  Diagnosis Date  . Migraines   . Depression   . Stress incontinence, female   . Sigmoid diverticulosis     severe & with angulated sigmoid  . HLD (hyperlipidemia)   . Costochondritis   . Hyperplastic rectal polyp     3mm    . GERD (gastroesophageal reflux disease)   . Diverticulosis     PAST SURGICAL HISTORY: Past Surgical History  Procedure Laterality Date  . Cesarean section      x 2  . Abdominal hysterectomy  1994  . Breast surgery  2012    breast reduction  . Umbilical hernia repair  1955  . Colonoscopy  2008    FAMILY HISTORY: Family History  Problem Relation Age of Onset  . Diabetes Maternal Aunt   . Diabetes Maternal Uncle   . Diabetes Maternal Grandmother   . Heart disease Paternal Grandmother   . Colon cancer    . Liver disease Father     SOCIAL HISTORY: History   Social History  . Marital Status: Married    Spouse Name: Richard    Number of Children: 2  . Years of Education: college   Occupational History  . school [employer]   Social History Main Topics  . Smoking status: Never Smoker   . Smokeless tobacco: Never Used  . Alcohol Use: 0.0 oz/week     Comment: social  . Drug Use: No  . Sexual Activity: Not on file   Other Topics Concern  . 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  Hispanic origin.   Patient is right handed.   Patient has a college education.   Patient drinks 2 cups daily.      PHYSICAL EXAM  Filed Vitals:   Cannot calculate BMI with a height equal to zero.  Generalized: Well developed, in no acute distress   Neurological examination  Mentation: Alert oriented to time, place, history taking. Follows all commands speech and language fluent Cranial nerve II-XII: Pupils were equal round reactive to light. Extraocular movements were full, visual field were full on confrontational test. Facial sensation and strength were normal.  Head turning and shoulder shrug  were normal and symmetric. Motor: The motor testing reveals 5 over 5 strength of all 4 extremities. Good symmetric motor tone is noted throughout.  Sensory: Sensory testing is intact to soft touch on  all 4 extremities. No evidence of extinction is noted.  Coordination: Cerebellar testing reveals good finger-nose-finger and heel-to-shin  bilaterally.  Gait and station: Gait is normal. Tandem gait is slightly unsteady. Romberg is negative. No drift is seen.  Reflexes: Deep tendon reflexes are symmetric and normal bilaterally.     DIAGNOSTIC DATA (LABS, IMAGING, TESTING) - I reviewed patient records, labs, notes, testing and imaging myself where available.  Lab Results  Component Value Date   WBC 5.7 08/26/2011   HGB 14.7 08/26/2011   HCT 47.9 08/26/2011   MCV 94.2 08/26/2011   PLT 216.0 02/25/2011      Component Value Date/Time   NA 146* 02/25/2011 1526   K 4.9 02/25/2011 1526   CL 110 02/25/2011 1526   CO2 27 02/25/2011 1526   GLUCOSE 130* 02/25/2011 1526   BUN 13 02/25/2011 1526   CREATININE 1.0 02/25/2011 1526   CALCIUM 9.6 02/25/2011 1526   PROT 7.1 02/25/2011 1526   ALBUMIN 4.4 02/25/2011 1526   AST 20 02/25/2011 1526   ALT 24 02/25/2011 1526   ALKPHOS 77 02/25/2011 1526   BILITOT 0.5 02/25/2011 1526       ASSESSMENT AND PLAN 58 y.o. year old female  has a past medical history of Migraines; Depression; Stress incontinence, tried and failed multiple preventative medications in the past for chronic migraine, she is now presenting with frequent headaches, 3-4 times each week, each episode lasts about one to 3 days, failed to improve multiple dose of Relpax, Aleve, IV Depacon infusion.  She came in for Botox injection as migraine prevention, potential side effect was explained, consent form was signed.  BOTOX injection was performed according to protocol by [employer]. 100 units of BOTOX was dissolved into 2 cc NS.   total of 155 units, discard 45 units .  Corrugator 2 sites, 10 units Procerus 1 site, 5 unit Frontalis 4 sites,  20 units, Temporalis 8 sites,  40 units  Occipitalis 6 sites, 30 units Cervical Paraspinal, 4 sites, 20 units Trapezius, 6 sites, 30 units  Patient  tolerate the injection well. Will return for repeat injection in 3 months.  I also note prescription of cambia as needed for abortive treatment    Levert FeinsteinYijun Kanoelani Dobies

## 2013-11-04 ENCOUNTER — Other Ambulatory Visit: Payer: BC Managed Care – PPO

## 2013-11-04 MED ORDER — ONABOTULINUMTOXINA 100 UNITS IJ SOLR
200.0000 [IU] | Freq: Once | INTRAMUSCULAR | Status: AC
  Administered 2013-11-04: 200 [IU] via INTRAMUSCULAR

## 2013-11-04 NOTE — Addendum Note (Signed)
Addended by: Levert FeinsteinYAN, Miasia Crabtree on: 11/04/2013 09:17 AM   Modules accepted: Orders

## 2013-11-08 ENCOUNTER — Telehealth: Payer: Self-pay | Admitting: Neurology

## 2013-11-08 MED ORDER — DICLOFENAC POTASSIUM(MIGRAINE) 50 MG PO PACK: 50.0000 mg | PACK | ORAL | Status: DC | PRN

## 2013-11-08 NOTE — Telephone Encounter (Signed)
Patient checking status of Rx for Cambia as discussed with Dr. Terrace ArabiaYan on 9/30.  [facility] [address]  Please call and advise.

## 2013-11-08 NOTE — Telephone Encounter (Signed)
Rx was already sent to the pharmacy on 09/30.  Rx has been resent.  I called the patient, got no answer.  Left message.

## 2013-11-11 ENCOUNTER — Ambulatory Visit (INDEPENDENT_AMBULATORY_CARE_PROVIDER_SITE_OTHER): Payer: BC Managed Care – PPO | Admitting: *Deleted

## 2013-11-11 ENCOUNTER — Telehealth: Payer: Self-pay

## 2013-11-11 ENCOUNTER — Telehealth: Payer: Self-pay | Admitting: Adult Health

## 2013-11-11 ENCOUNTER — Encounter (INDEPENDENT_AMBULATORY_CARE_PROVIDER_SITE_OTHER): Payer: Self-pay

## 2013-11-11 ENCOUNTER — Encounter: Payer: Self-pay | Admitting: Adult Health

## 2013-11-11 ENCOUNTER — Ambulatory Visit (INDEPENDENT_AMBULATORY_CARE_PROVIDER_SITE_OTHER): Payer: BC Managed Care – PPO | Admitting: Adult Health

## 2013-11-11 VITALS — BP 98/70 | HR 68 | Wt 144.0 lb

## 2013-11-11 DIAGNOSIS — G43011 Migraine without aura, intractable, with status migrainosus: Secondary | ICD-10-CM

## 2013-11-11 MED ORDER — KETOROLAC TROMETHAMINE 60 MG/2ML IM SOLN
60.0000 mg | Freq: Once | INTRAMUSCULAR | Status: AC
  Administered 2013-11-11: 60 mg via INTRAMUSCULAR

## 2013-11-11 NOTE — Telephone Encounter (Signed)
Patient calling in stating that she has had a migraine since Friday, states she is having trouble with her vision also, offered patient appointment for today with NP MM, patient refused, stating that she wanted to know if the Relpax is causing rebound headaches.

## 2013-11-11 NOTE — Telephone Encounter (Signed)
Patient called and scheduled an appointment for today. I will speak with her then regarding her migraines.
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
## 2013-11-11 NOTE — Telephone Encounter (Signed)
[facility] notified us they have approved our request for coverage on Cambia effective until 11/08/2014 Ref # [id number]

## 2013-11-11 NOTE — Progress Notes (Signed)
Patient was given Toradol 60 mg IM right glut under aseptic technique. She tolerated well bandaid applied.

## 2013-11-11 NOTE — Patient Instructions (Signed)

## 2013-11-11 NOTE — Patient Instructions (Signed)
Patient leaves with a driver.

## 2013-11-18 ENCOUNTER — Other Ambulatory Visit: Payer: Self-pay | Admitting: Neurology

## 2013-11-24 ENCOUNTER — Ambulatory Visit: Payer: BC Managed Care – PPO | Admitting: Neurology

## 2013-11-29 ENCOUNTER — Other Ambulatory Visit: Payer: Self-pay | Admitting: Internal Medicine

## 2013-11-29 ENCOUNTER — Ambulatory Visit
Admission: RE | Admit: 2013-11-29 | Discharge: 2013-11-29 | Disposition: A | Discharge status: Home / Self Care | Payer: BC Managed Care – PPO | Source: Ambulatory Visit | Attending: Internal Medicine | Admitting: Internal Medicine

## 2013-11-29 DIAGNOSIS — R1084 Generalized abdominal pain: Secondary | ICD-10-CM

## 2013-12-23 ENCOUNTER — Ambulatory Visit: Payer: BC Managed Care – PPO | Admitting: Neurology

## 2014-01-05 ENCOUNTER — Telehealth: Payer: Self-pay | Admitting: Adult Health

## 2014-01-05 MED ORDER — TRAMADOL HCL 50 MG PO TABS: 25.0000 mg | ORAL_TABLET | Freq: Four times a day (QID) | ORAL | Status: DC | PRN

## 2014-01-05 NOTE — Telephone Encounter (Signed)
I called the patient. Patient has had an ongoing headache for 2 weeks. She has missed work due to this. She has been using her Relpax but is trying not to use it every day since she only gets 8 pills a month. I will give the patient Ultram 25 mg every 6 hours for pain. The patient will be know if she is unable to tolerate this. The patient does have an allergy to codeine. She states that she takes it in high amounts he will call itching. I advised her to let me know if itching occurs with the tramadol. The patient never started nortriptyline as a preventative medication she states in the past amitriptyline has caused her to gain weight.

## 2014-01-05 NOTE — Telephone Encounter (Signed)
I called back, got no answer.   Patient is requesting a Rx for oral Toradol to take for headache.  Please advise.  Thank you.

## 2014-01-05 NOTE — Telephone Encounter (Signed)
Patient is calling back for a returned call. She states she did not answer the telephone call from Shanda BumpsJessica because she does not answer a call from a private number. She [facility] on [facility] to [facility].

## 2014-01-05 NOTE — Telephone Encounter (Signed)
Patient is calling because she has had a headache for two weeks and unable to function. Patient has been taking Relpax but she only gets 8 pills a month and tries to save medication until headache is really bad. She has taken Alleve and Alka Seltzer plus also. Is there a pill form of Teredol that can be prescribed. [facility] [employer]. Please call patient and advise. She is very anxious to talk.

## 2014-01-19 ENCOUNTER — Ambulatory Visit (INDEPENDENT_AMBULATORY_CARE_PROVIDER_SITE_OTHER): Payer: BC Managed Care – PPO

## 2014-01-19 DIAGNOSIS — G43019 Migraine without aura, intractable, without status migrainosus: Secondary | ICD-10-CM

## 2014-01-20 NOTE — Progress Notes (Signed)
Called patient, advised of normal MRA

## 2014-02-02 ENCOUNTER — Encounter: Payer: Self-pay | Admitting: Neurology

## 2014-02-02 ENCOUNTER — Telehealth: Payer: Self-pay | Admitting: Neurology

## 2014-02-02 ENCOUNTER — Ambulatory Visit (INDEPENDENT_AMBULATORY_CARE_PROVIDER_SITE_OTHER): Payer: BC Managed Care – PPO | Admitting: Neurology

## 2014-02-02 DIAGNOSIS — G43719 Chronic migraine without aura, intractable, without status migrainosus: Secondary | ICD-10-CM

## 2014-02-02 MED ORDER — ONABOTULINUMTOXINA 100 UNITS IJ SOLR
200.0000 [IU] | Freq: Once | INTRAMUSCULAR | Status: AC
  Administered 2014-02-02: 200 [IU] via INTRAMUSCULAR

## 2014-02-02 NOTE — Telephone Encounter (Signed)
Alice Cruz:  Can we preauthorize with her insurance to see if she can get 15 tabs of Relpax each month instead of 8 tabs each month

## 2014-02-02 NOTE — Progress Notes (Signed)
PATIENT: Alice Cruz DOB: 01/04/1956  HISTORY OF PRESENT ILLNESS: Ms. Alice Cruz is a 58  year old female with a history of migraines returned for Botox injection as migraine prevention  She had a long history of chronic migraine headaches, also with past medical history of depression, works as a Clinical biochemistschool counselor, she reported a history of migraine since 1971, at age 58, getting worse since 1978, frequent migraine headaches, had extensive evaluation during that period of time, "nothing was found", including  normal spinal tap    She started menopause around age 58, she began to have more intense, more frequent headaches on a daily basis since 2005,  she has tried Tylenol PM, Aleve twice a day, without helping,   She has been missing 4-5 days of work each month because of her headaches, depression, she was evaluated by [facility] headache specialist Dr. Renaldo Cruz in May 2013, was started on Zonegran 150 mg every day initially was helping her some ,   She was also evaluated by headache specialist Dr. Neale Cruz, who has suggested increasing dose of Zonegran to 200 mg every day, she reported increased headache.  Over the years, she also tried different preventative medications,  amitriptyline, no help, gained 20 pounds, topiramate, difficulty focusing,  Depakote does not help her headache much  For her depression, she is also taking Lexapro 20 mg every day for one year, temazepam 1 mg twice a day since 1995, continued to combating depression,   She described her migraine as bitemporal, bifrontal, retro-orbital area severe pounding headache, with associated light noise sensitivity,  She has failed Imitrex and Maxalt in the past.  Relpax seems to work fine most of the time,  She has been complaining frequent headaches, daily basis, severe, lasting one to to 3 days since early September, 2015 tried different measures to break the cycle, including IV Depacon infusion, steroids tapering dose twice, Toradol  injection, without sustained benefit,   She has long-standing history of urinary urgency, taking VESIcare  We have reviewed MRI of the brain in Sep 2015, single isolated Left frontal subcortical gliosis  UPDATE Feb 02 2014:  She has to resign from her job, she is in the process of apply for disability, she continue has frequent headaches, her typical migraine is right retrorbital headaches severe pounding headaches sometimes involves occipital regions, lasting few days up to 20 days, Toradol has been helpful, cambia did not help, during headache, she complains of light, noise sensitivity,  During past 3 months, she has to go the [facility] urgent care  few times to get toradol intramuscular injection, she used all allowed Relpax 8 tablet each month                                                                                                                                  REVIEW OF SYSTEMS: Full 14 system review of systems performed and notable only for:  As above  ALLERGIES: Allergies  Allergen Reactions  . Viibryd [Vilazodone Hcl] Diarrhea  . Codeine Itching    HOME MEDICATIONS: Outpatient Prescriptions Prior to Visit  Medication Sig Dispense Refill  . atorvastatin (LIPITOR) 10 MG tablet Take 10 mg by mouth daily.    . B Complex Vitamins (B COMPLEX PO) Take by mouth as directed.    . Calcium Carbonate-Vitamin D (CALTRATE 600+D) 600-400 MG-UNIT per chew tablet Chew 1 tablet by mouth daily.    . clonazePAM (KLONOPIN) 1 MG tablet Take 2 mg by mouth 2 (two) times daily as needed.     . Diclofenac Potassium 50 MG PACK Take 50 mg by mouth as needed. 9 each 11  . eletriptan (RELPAX) 40 MG tablet Take 1 tablet (40 mg total) by mouth as needed for migraine (May repeat once in 2 hours if needed). 8 tablet 11  . escitalopram (LEXAPRO) 20 MG tablet Take 20 mg by mouth daily.    . Vitamin D, Ergocalciferol, (DRISDOL) 50000 UNITS CAPS capsule Take 50,000 Units by mouth every 7 (seven) days.      Marland Kitchen. solifenacin (VESICARE) 10 MG tablet Take 10 mg by mouth daily.    . traMADol (ULTRAM) 50 MG tablet Take 0.5 tablets (25 mg total) by mouth every 6 (six) hours as needed. 30 tablet 0   No facility-administered medications prior to visit.    PAST MEDICAL HISTORY: Past Medical History  Diagnosis Date  . Migraines   . Depression   . Stress incontinence, female   . Sigmoid diverticulosis     severe & with angulated sigmoid  . HLD (hyperlipidemia)   . Costochondritis   . Hyperplastic rectal polyp     3mm  . GERD (gastroesophageal reflux disease)   . Diverticulosis     PAST SURGICAL HISTORY: Past Surgical History  Procedure Laterality Date  . Cesarean section      x 2  . Abdominal hysterectomy  1994  . Breast surgery  2012    breast reduction  . Umbilical hernia repair  1955  . Colonoscopy  2008    FAMILY HISTORY: Family History  Problem Relation Age of Onset  . Diabetes Maternal Aunt   . Diabetes Maternal Uncle   . Diabetes Maternal Grandmother   . Heart disease Paternal Grandmother   . Colon cancer    . Liver disease Father     SOCIAL HISTORY: History   Social History  . Marital Status: Married    Spouse Name: Alice Cruz    Number of Children: 2  . Years of Education: college   Occupational History  . school [employer]   Social History Main Topics  . Smoking status: Never Smoker   . Smokeless tobacco: Never Used  . Alcohol Use: 0.0 oz/week     Comment: social  . Drug Use: No  . Sexual Activity: Not on file   Other Topics Concern  . 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  Hispanic origin.   Patient is right handed.   Patient has a college education.   Patient drinks 2 cups daily.      PHYSICAL EXAM  Filed Vitals:   Cannot calculate BMI with a height equal to zero.  Generalized: Well developed, in no acute distress   Neurological examination   Mentation: Alert oriented to time, place, history taking. Follows all commands speech and language fluent Cranial nerve II-XII: Pupils were equal round  reactive to light. Extraocular movements were full, visual field were full on confrontational test. Facial sensation and strength were normal.  Head turning and shoulder shrug  were normal and symmetric. Motor: The motor testing reveals 5 over 5 strength of all 4 extremities. Good symmetric motor tone is noted throughout.  Sensory: Sensory testing is intact to soft touch on all 4 extremities. No evidence of extinction is noted.  Coordination: Cerebellar testing reveals good finger-nose-finger and heel-to-shin bilaterally.  Gait and station: Gait is normal. Tandem gait is slightly unsteady. Romberg is negative. No drift is seen.  Reflexes: Deep tendon reflexes are symmetric and normal bilaterally.     DIAGNOSTIC DATA (LABS, IMAGING, TESTING) - I reviewed patient records, labs, notes, testing and imaging myself where available.  Lab Results  Component Value Date   WBC 5.7 08/26/2011   HGB 14.7 08/26/2011   HCT 47.9 08/26/2011   MCV 94.2 08/26/2011   PLT 216.0 02/25/2011      Component Value Date/Time   NA 146* 02/25/2011 1526   K 4.9 02/25/2011 1526   CL 110 02/25/2011 1526   CO2 27 02/25/2011 1526   GLUCOSE 130* 02/25/2011 1526   BUN 13 02/25/2011 1526   CREATININE 1.0 02/25/2011 1526   CALCIUM 9.6 02/25/2011 1526   PROT 7.1 02/25/2011 1526   ALBUMIN 4.4 02/25/2011 1526   AST 20 02/25/2011 1526   ALT 24 02/25/2011 1526   ALKPHOS 77 02/25/2011 1526   BILITOT 0.5 02/25/2011 1526    ASSESSMENT AND PLAN 58 y.o. year old female  has a past medical history of Migraines; Depression; Stress incontinence, tried and failed multiple preventative medications in the past for chronic migraine, she is now presenting with frequent headaches, 3-4 times each week, each episode lasts about one to 3 days, failed to improve multiple dose of Relpax,  Aleve, IV Depacon infusion.  She came in for Botox injection as migraine prevention, first injection was in September 2015, no significant side effect noticed, but she did not notice any significant improvement either  BOTOX injection was performed according to protocol by Aller[employer]. 100 units of BOTOX was dissolved into 2 cc NS.   Total of 155 units, discard 45 units  Corrugator 2 sites, 10 units Procerus 1 site, 5 unit Frontalis 4 sites,  20 units, Temporalis 8 sites,  40 units  Occipitalis 6 sites, 30 units Cervical Paraspinal, 4 sites, 20 units Trapezius, 6 sites, 30 units  Patient tolerate the injection well. Will return for repeat injection in 3 months.    Levert Feinstein, M.D. Ph.D.  [facility] [address] [place], [place] [address] Phone: [phone] Fax:      [phone]

## 2014-02-24 ENCOUNTER — Telehealth: Payer: Self-pay | Admitting: Neurology

## 2014-02-24 MED ORDER — ELETRIPTAN HYDROBROMIDE 40 MG PO TABS: 40.0000 mg | ORAL_TABLET | ORAL | Status: DC | PRN

## 2014-02-24 NOTE — Telephone Encounter (Signed)
Rx has been sent.  I called the patient back, got no answer.  Left message.  

## 2014-02-24 NOTE — Telephone Encounter (Signed)
Patient requesting Rx refill for eletriptan (RELPAX) 40 MG tablet, has changed to [facility].  Stated pharmacy has reached out several times regarding refill.  Please call and advise.

## 2014-02-28 ENCOUNTER — Encounter: Payer: Self-pay | Admitting: Adult Health

## 2014-02-28 ENCOUNTER — Ambulatory Visit (INDEPENDENT_AMBULATORY_CARE_PROVIDER_SITE_OTHER): Payer: BC Managed Care – PPO | Admitting: Adult Health

## 2014-02-28 VITALS — BP 110/75 | HR 90 | Ht 61.0 in | Wt 144.0 lb

## 2014-02-28 DIAGNOSIS — G43111 Migraine with aura, intractable, with status migrainosus: Secondary | ICD-10-CM

## 2014-02-28 MED ORDER — PREGABALIN 50 MG PO CAPS: 50.0000 mg | ORAL_CAPSULE | Freq: Every day | ORAL | Status: AC

## 2014-02-28 MED ORDER — KETOROLAC TROMETHAMINE 60 MG/2ML IM SOLN
60.0000 mg | Freq: Once | INTRAMUSCULAR | Status: AC
  Administered 2014-02-28: 60 mg via INTRAMUSCULAR

## 2014-02-28 MED ORDER — PREGABALIN 50 MG PO CAPS: 50.0000 mg | ORAL_CAPSULE | Freq: Every day | ORAL | Status: DC
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
## 2014-02-28 NOTE — Patient Instructions (Addendum)
We will start Lyrica 50 mg daily- SAMPLE given Will give Toradol today.  In the future we may consider sphenocath.  Pregabalin oral solution What is this medicine? PREGABALIN (pre GAB a lin) is used to treat nerve pain from diabetes, shingles, spinal cord injury, and fibromyalgia. It is also used to control seizures in epilepsy. This medicine may be used for other purposes; ask your health care provider or pharmacist if you have questions. COMMON BRAND NAME(S): Lyrica What should I tell my health care provider before I take this medicine? They need to know if you have any of these conditions: -bleeding problems -heart disease, including heart failure -history of alcohol or drug abuse -kidney disease -suicidal thoughts, plans, or attempt; a previous suicide attempt by you or a family member -an unusual or allergic reaction to pregabalin, gabapentin, other medicines, foods, dyes, or preservatives -pregnant or trying to get pregnant or trying to conceive with your partner -breast-feeding How should I use this medicine? Take this medicine by mouth. Follow the directions on the prescription label. Use a specially marked spoon or container to measure each dose. Ask your pharmacist if you do not have one. Household spoons are not accurate. You can take this medicine with or without food. Take your medicine at regular intervals. Do not take it more often than directed. Do not stop taking except on your doctor's advice. A special MedGuide will be given to you by the pharmacist with each prescription and refill. Be sure to read this information carefully each time. Talk to your pediatrician regarding the use of this medicine in children. Special care may be needed. Overdosage: If you think you've taken too much of this medicine contact a poison control center or emergency room at once. Overdosage: If you think you have taken too much of this medicine contact a poison control center or emergency room at  once. NOTE: This medicine is only for you. Do not share this medicine with others. What if I miss a dose? If you miss a dose, take it as soon as you can. If it is almost time for your next dose, take only that dose. Do not take double or extra doses. What may interact with this medicine? -alcohol -certain medicines for blood pressure like captopril, enalapril, or lisinopril -certain medicines for diabetes, like pioglitazone or rosiglitazone -certain medicines for anxiety or sleep -narcotic medicines for pain This list may not describe all possible interactions. Give your health care provider a list of all the medicines, herbs, non-prescription drugs, or dietary supplements you use. Also tell them if you smoke, drink alcohol, or use illegal drugs. Some items may interact with your medicine. What should I watch for while using this medicine? Tell your doctor or healthcare professional if your symptoms do not start to get better or if they get worse. Visit your doctor or health care professional for regular checks on your progress. Do not stop taking except on your doctor's advice. You may develop a severe reaction. Your doctor will tell you how much medicine to take. Wear a medical identification bracelet or chain if you are taking this medicine for seizures, and carry a card that describes your disease and details of your medicine and dosage times. You may get drowsy or dizzy. Do not drive, use machinery, or do anything that needs mental alertness until you know how this medicine affects you. Do not stand or sit up quickly, especially if you are an older patient. This reduces the risk of dizzy  or fainting spells. Alcohol may interfere with the effect of this medicine. Avoid alcoholic drinks. If you have a heart condition, like congestive heart failure, and notice that you are retaining water and have swelling in your hands or feet, contact your health care provider immediately. The use of this  medicine may increase the chance of suicidal thoughts or actions. Pay special attention to how you are responding while on this medicine. Any worsening of mood, or thoughts of suicide or dying should be reported to your health care professional right away. This medicine has caused reduced sperm counts in some men. This may interfere with the ability to father a child. You should talk to your doctor or health care professional if you are concerned about your fertility. Women who become pregnant while using this medicine for seizures may enroll in the [place] American Antiepileptic Drug Pregnancy Registry by calling [phone]. This registry collects information about the safety of antiepileptic drug use during pregnancy. What side effects may I notice from receiving this medicine? Side effects that you should report to your doctor or health care professional as soon as possible: -allergic reactions like skin rash, itching or hives, swelling of the face, lips, or tongue -breathing problems -changes in vision -chest pain -confusion -jerking or unusual movements of any part of your body -loss of memory -muscle pain, tenderness, or weakness -suicidal thoughts or other mood changes -swelling of the ankles, feet, hands -unusual bruising or bleeding Side effects that usually do not require medical attention (Report these to your doctor or health care professional if they continue or are bothersome.): -dizziness -drowsiness -dry mouth -headache -nausea -tremors -trouble sleeping -weight gain This list may not describe all possible side effects. Call your doctor for medical advice about side effects. You may report side effects to FDA at 1-800-FDA-1088. Where should I keep my medicine? Keep out of the reach of children. This medicine can be abused. Keep your medicine in a safe place to protect it from theft. Do not share this medicine with anyone. Selling or giving away this medicine is dangerous  and against the law. Store at room temperature between 15 and 30 degrees C (59 and 86 degrees F). Throw away any unused medicine after the expiration date. NOTE: This sheet is a summary. It may not cover all possible information. If you have questions about this medicine, talk to your doctor, pharmacist, or health care provider.  2015, Elsevier/Gold Standard. (2010-07-26 20:02:25)

## 2014-03-14 ENCOUNTER — Ambulatory Visit: Payer: BC Managed Care – PPO | Admitting: Adult Health

## 2014-05-09 ENCOUNTER — Telehealth: Payer: Self-pay | Admitting: Neurology

## 2014-05-09 NOTE — Telephone Encounter (Signed)
LMVM of patient to let her know to call the office concerning her botox. She was denied to continue injections.

## 2014-05-10 ENCOUNTER — Telehealth: Payer: Self-pay | Admitting: Neurology

## 2014-05-10 NOTE — Telephone Encounter (Signed)
Last Botox injection was 02/02/14 for migraines - she felt is was helpful and she would like to continue treatment - ok to check with Denese KillingsJanisha to see if we can do specialty pharmacy for her?

## 2014-05-10 NOTE — Telephone Encounter (Signed)
Patient wants a call back from Dr. Terrace ArabiaYan to discuss getting her Botox reinstated. Please call [phone].

## 2014-05-11 DIAGNOSIS — Z0289 Encounter for other administrative examinations: Secondary | ICD-10-CM

## 2014-05-11 NOTE — Telephone Encounter (Signed)
Left message to return my call.  

## 2014-05-11 NOTE — Telephone Encounter (Signed)
Alice KillingsJanisha, has her insurance denied her BOTOX injection? If that is the case, let her know.

## 2014-05-11 NOTE — Telephone Encounter (Signed)
Patient returning Alice [place], [place] call.  Please return call to [phone] at earliest convenience.

## 2014-05-11 NOTE — Telephone Encounter (Signed)
Patient agreeable to coming in for an appointment to further discuss Botox treatments.

## 2014-05-11 NOTE — Telephone Encounter (Signed)
Per Denese KillingsJanisha, Botox has been denied by insurance and is currently in an appeal status due to patient reporting lack of improvement with treatments.  Dr. Terrace ArabiaYan is going to see her in the office to see if treatment can be approved again.

## 2014-05-11 NOTE — Telephone Encounter (Signed)
Duplicate encounter - see other notes.  

## 2014-05-16 ENCOUNTER — Ambulatory Visit: Payer: Self-pay | Admitting: Neurology

## 2014-05-16 ENCOUNTER — Telehealth: Payer: Self-pay | Admitting: *Deleted

## 2014-05-16 NOTE — Telephone Encounter (Signed)
Cancel appointment same day.

## 2014-05-17 ENCOUNTER — Encounter: Payer: Self-pay | Admitting: Neurology

## 2014-05-19 ENCOUNTER — Other Ambulatory Visit: Payer: Self-pay | Admitting: Dermatology

## 2014-08-04 ENCOUNTER — Telehealth: Payer: Self-pay

## 2014-08-04 NOTE — Telephone Encounter (Signed)
LVM for patient to call office to discuss Botox. 

## 2014-11-22 ENCOUNTER — Telehealth: Payer: Self-pay | Admitting: Neurology

## 2014-11-22 NOTE — Telephone Encounter (Signed)
Patient calling for refill for Relpax 40 mg . Patient gets a 90 supply. Patient is also getting set back up to come in for her Botox injections. Last injection 02/02/2014.

## 2014-11-22 NOTE — Telephone Encounter (Signed)
I will call and get patient scheduled.

## 2014-11-23 ENCOUNTER — Ambulatory Visit (INDEPENDENT_AMBULATORY_CARE_PROVIDER_SITE_OTHER): Payer: BC Managed Care – PPO | Admitting: Adult Health

## 2014-11-23 ENCOUNTER — Encounter: Payer: Self-pay | Admitting: Adult Health

## 2014-11-23 VITALS — BP 111/74 | HR 82 | Ht 61.0 in | Wt 138.0 lb

## 2014-11-23 DIAGNOSIS — G43019 Migraine without aura, intractable, without status migrainosus: Secondary | ICD-10-CM

## 2014-11-23 MED ORDER — PREDNISONE 5 MG PO TABS: ORAL_TABLET | ORAL | Status: AC

## 2014-11-23 MED ORDER — ELETRIPTAN HYDROBROMIDE 40 MG PO TABS: 40.0000 mg | ORAL_TABLET | ORAL | Status: AC | PRN

## 2014-11-23 NOTE — Patient Instructions (Signed)
Begin Prednisone dose pack. Relpax refilled.  If your symptoms worsen or you develop new symptoms please let us know.  [place]

## 2014-11-23 NOTE — Progress Notes (Signed)
PATIENT: Alice Cruz DOB: 01-07-1956  REASON FOR VISIT: follow up- migraine  HISTORY FROM: patient  HISTORY OF PRESENT ILLNESS: Alice Cruz is a 59 year old female with a history of intractable migraine headaches. She returns today for follow-up. The patient currently uses Relpax and finds it very beneficial for her migraines. The patient has tried multiple medications in the past without benefit. She has also received 2 rounds of Botox but is unsure of the benefit. Her last Botox injection was in December. She states that she has approximately 10-15 headaches a month. She states recently she's had a ongoing headache for 2 weeks. She does state that she has [place] she [place] any headaches. She thinks this has to do with the barometric pressure. For that reason her and her [place] have decided [place]. At this time the patient wants to hold off on any Botox injections until she sees how her headaches are affected once she makes this move. The patient states that she is currently trying to wean off her Klonopin. She states that she starting to feel the withdrawal effect now. She states that last night she was unable to sleep. She has not notified her psychiatrist of the symptoms. She returns today for an evaluation.  HISTORY 02/28/14: Alice. Cruz is a 59 year old female with a history of migraines. She returns today for follow-up. She is currently using Relpax and ultram for her migraines. She states that the ultram is expensive so she uses it sparingly. The patient also receives Botox injections. She states that she has had 2 rounds of botox but does not feel that it has been beneficial. She states that her headaches have not changed. She has approximately 2-3 headaches per week. If she takes the Relpax her headache will resolve within 2 hours. She states it does leave her with a hang over effect. She does have a bad migraine today. She rates her pain  8/10 today. Her headache is located over the right eye. Confirms photophobia and phonophobia with the migraine. Denies nausea and vomiting. She states that Toradol injections does help. She states that when the headache begins she may see green dot/s. She states that she always has a running nose. She is on myrbetriq of urinary incontinence. She will have watery eyes and a stuffy nose on the side in which her head hurts with the migraines. She will also complain of sharp stabbing pain that last less than 1 minute with her migraines. However she is unsure if they consistently happen with every migraine. The patient has been on a multiple medications without benefit.   REVIEW OF SYSTEMS: Out of a complete 14 system review of symptoms, the patient complains only of the following symptoms, and all other reviewed systems are negative.  Blurred vision, cough, runny nose, insomnia, daytime sleepiness, depression, nervous/anxious, headache  ALLERGIES: Allergies  Allergen Reactions  . Viibryd [Vilazodone Hcl] Diarrhea  . Codeine Itching    HOME MEDICATIONS: Outpatient Prescriptions Prior to Visit  Medication Sig Dispense Refill  . atorvastatin (LIPITOR) 10 MG tablet Take 10 mg by mouth daily.    . clonazePAM (KLONOPIN) 1 MG tablet Take 2 mg by mouth 2 (two) times daily as needed.     Marland Kitchen escitalopram (LEXAPRO) 20 MG tablet Take 20 mg by mouth daily.    Marland Kitchen MYRBETRIQ 50 MG TB24 tablet 50 mg daily.    . pregabalin (LYRICA) 50 MG  capsule Take 1 capsule (50 mg total) by mouth daily. 64 capsule 0  . eletriptan (RELPAX) 40 MG tablet Take 1 tablet (40 mg total) by mouth as needed for migraine (May repeat once in 2 hours if needed). 24 tablet 3  . B Complex Vitamins (B COMPLEX PO) Take by mouth as directed.    . Calcium Carbonate-Vitamin D (CALTRATE 600+D) 600-400 MG-UNIT per chew tablet Chew 1 tablet by mouth daily.    . Diclofenac Potassium 50 MG PACK Take 50 mg by mouth as needed. 9 each 11  . Vitamin D,  Ergocalciferol, (DRISDOL) 50000 UNITS CAPS capsule Take 50,000 Units by mouth every 7 (seven) days.     No facility-administered medications prior to visit.    PAST MEDICAL HISTORY: Past Medical History  Diagnosis Date  . Migraines   . Depression   . Stress incontinence, female   . Sigmoid diverticulosis     severe & with angulated sigmoid  . HLD (hyperlipidemia)   . Costochondritis   . Hyperplastic rectal polyp     3mm  . GERD (gastroesophageal reflux disease)   . Diverticulosis     PAST SURGICAL HISTORY: Past Surgical History  Procedure Laterality Date  . Cesarean section      x 2  . Abdominal hysterectomy  1994  . Breast surgery  2012    breast reduction  . Umbilical hernia repair  1955  . Colonoscopy  2008    FAMILY HISTORY: Family History  Problem Relation Age of Onset  . Diabetes Maternal Aunt   . Diabetes Maternal Uncle   . Diabetes Maternal Grandmother   . Heart disease Paternal Grandmother   . Colon cancer    . Liver disease Father     SOCIAL HISTORY: Social History   Social History  . Marital Status: Married    Spouse Name: Alice Cruz  . Number of Children: 2  . Years of Education: college   Occupational History  . school [employer]   Social History Main Topics  . Smoking status: Never Smoker   . Smokeless tobacco: Never Used  . Alcohol Use: 0.0 oz/week     Comment: social  . Drug Use: No  . Sexual Activity: Not on file   Other Topics Concern  . 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  Hispanic origin.   Patient is right handed.   Patient has a college education.   Patient drinks 2 cups daily.      PHYSICAL EXAM  Filed Vitals:   11/23/14 1525  BP: 111/74  Pulse: 82  Height: 5\' 1"  (1.549 m)  Weight: 138 lb (62.596 kg)   Body mass index is 26.09 kg/(m^2).  Generalized: Well developed, in no acute distress   Neurological examination    Mentation: Alert oriented to time, place, history taking. Follows all commands speech and language fluent Cranial nerve II-XII: Pupils were equal round reactive to light. Extraocular movements were full, visual field were full on confrontational test. Facial sensation and strength were normal. Uvula tongue midline. Head turning and shoulder shrug  were normal and symmetric. Motor: The motor testing reveals 5 over 5 strength of all 4 extremities. Good symmetric motor tone is noted throughout.  Sensory: Sensory testing is intact to soft touch on all 4 extremities. No evidence of extinction is noted.  Coordination: Cerebellar testing reveals good finger-nose-finger and heel-to-shin bilaterally.  Gait and station: Gait is  normal. Tandem gait is normal. Romberg is negative. No drift is seen.  Reflexes: Deep tendon reflexes are symmetric and normal bilaterally.   DIAGNOSTIC DATA (LABS, IMAGING, TESTING) - I reviewed patient records, labs, notes, testing and imaging myself where available.   ASSESSMENT AND PLAN 59 y.o. year old female  has a past medical history of Migraines; Depression; Stress incontinence, female; Sigmoid diverticulosis; HLD (hyperlipidemia); Costochondritis; Hyperplastic rectal polyp; GERD (gastroesophageal reflux disease); and Diverticulosis. here with:  1. Migraine headaches  The patient plans to move to the [facility] to see if it benefits her migraine headaches. For now she'll continue on the Relpax. I will send in a refill today. The patient has had an ongoing headache for 2 weeks without any relief. I will order the patient a prednisone Dosepak. She's had this in the past and has found it very beneficial. Patient advised that if her symptoms worsen or she develops any new symptoms she should let us know. Otherwise she will follow-up in 6 months. The patient states that she does plan to keep all of her physicians here locally and will travel from the [facility] for this visit.  Butch Penny, MSN, NP-C 11/23/2014, 4:25 PM [facility] [address] [place], [place] [address] [phone]

## 2014-11-23 NOTE — Telephone Encounter (Signed)
Patient coming in today to see Megan.

## 2014-11-24 ENCOUNTER — Other Ambulatory Visit: Payer: Self-pay | Admitting: Adult Health

## 2014-11-24 NOTE — Telephone Encounter (Signed)
Please call Fatima SangerLeslie Owens and get more details regarding this. I did not prescribe this medication and would rather not refill.

## 2014-11-24 NOTE — Telephone Encounter (Signed)
I called pt.   She has been taking klonopin 2mg  po daily she states for years by Dr. Fatima SangerLeslie Owens (her mental health provider).  She states that she will not talk to her or give her a prescritpion for this [place]  She stated she spoke to you about this.  Would like to know if you would give prescription for 90 days to tie her over until she gets [place] provider [place].  [facility].  She was very anxious and tearful.  I told her [facility] would [facility].

## 2014-11-24 NOTE — Telephone Encounter (Signed)
I called pt back.  She had gotten in touch with Dr. Secundino GingerLeslie O'Neill, psychologist and did see her today and she changed her medication.  She did not need us now.  She thanked us for calling back.

## 2014-11-24 NOTE — Telephone Encounter (Signed)
Patient is calling regarding medication clonazePAM (KLONOPIN) 1 MG tablet. She needs to discuss this medication and will not be available today from 12pm-1pm when she will be at the dentist. Thank you.

## 2014-11-24 NOTE — Telephone Encounter (Signed)
I called and LMVM for pt that you had already left for the day.  I told her that I believe you pick up your messages on Friday and may call her.  I also relayed that Dr. Pete GlatterStoneking may be another option for her to call since he is her pcp.  I looked up Dr. Fatima SangerLeslie Owens and was not sure she in [place] or if in [place]?

## 2014-11-26 NOTE — Progress Notes (Signed)
I have reviewed and agreed above plan. 

## 2014-11-30 ENCOUNTER — Telehealth: Payer: Self-pay | Admitting: Neurology

## 2014-11-30 NOTE — Telephone Encounter (Signed)
[employer] called to speak with Danielle. She took call

## 2014-11-30 NOTE — Telephone Encounter (Signed)
Spoke with [employer] and they stated that they needed OV notes on the patient. Sent OV notes over VIA fax.

## 2014-12-27 ENCOUNTER — Telehealth: Payer: Self-pay | Admitting: Neurology

## 2014-12-27 NOTE — Telephone Encounter (Signed)
Dawn with [employer] will be faxing a from for authorization of Botox for this patient.  Thanks!

## 2015-01-05 NOTE — Telephone Encounter (Signed)
Spoke with patient and she stated that she has decided not to receive injections.

## 2015-01-05 NOTE — Telephone Encounter (Signed)
Spoke with patient and she stated that she has decided not to receive injections. No need to proceed with authorizations.

## 2015-05-25 ENCOUNTER — Ambulatory Visit: Payer: BC Managed Care – PPO | Admitting: Adult Health

## 2015-09-09 IMAGING — CT CT ABD-PELV W/ CM
2 of 5 series · 9 of 36 positions shown, 15 images · IV contrast (OMNI 300/WATER & [ID] OMNI 300)
Comparison: 09/04/2012

CLINICAL DATA: Acute onset of left lower quadrant pain, history of
diverticulitis

EXAM:
CT ABDOMEN AND PELVIS WITH CONTRAST
TECHNIQUE: Multidetector CT imaging of the abdomen and pelvis was performed
using the standard protocol following bolus administration of
intravenous contrast.
CONTRAST:  30mL OMNIPAQUE IOHEXOL 300 MG/ML SOLN, 100mL OMNIPAQUE
IOHEXOL 300 MG/ML SOLN

[Series 3: abd/pelvis with · axial · 0.70mm/px · z∈[-277,+38]mm · 8 of 81 slices shown, 13 images]
[im 9/81  soft-tissue]
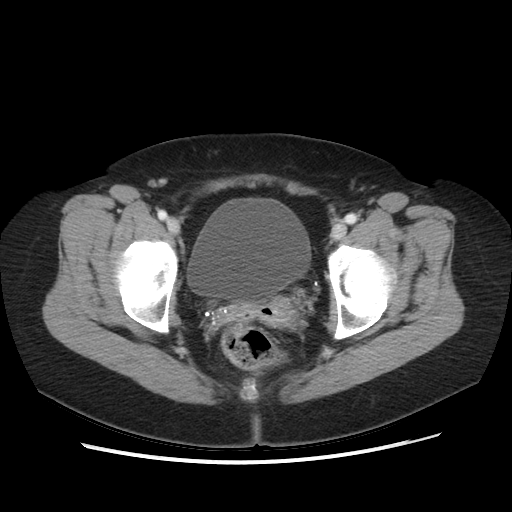
[im 9/81  bone]
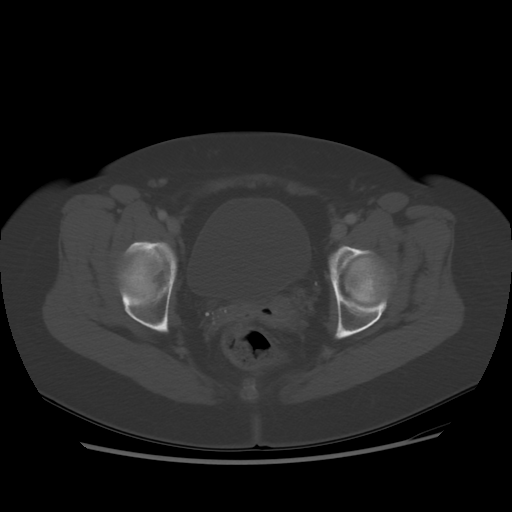
[im 18/81  soft-tissue]
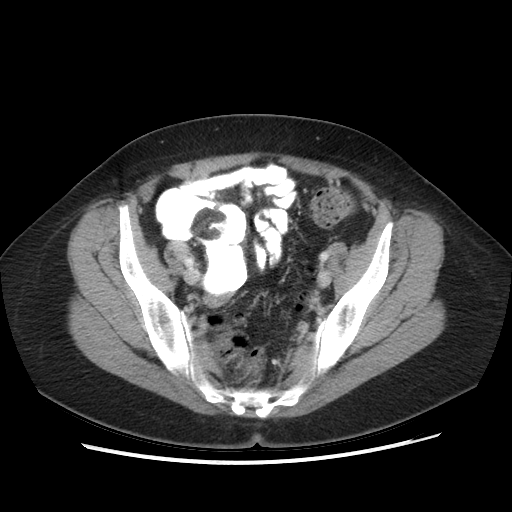
[im 27/81  soft-tissue]
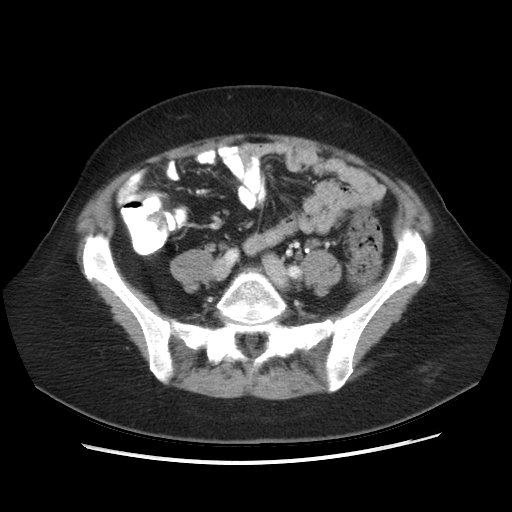
[im 36/81  soft-tissue]
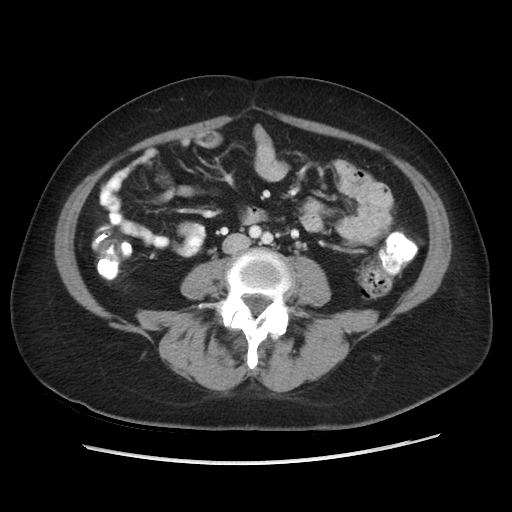
[im 45/81  soft-tissue]
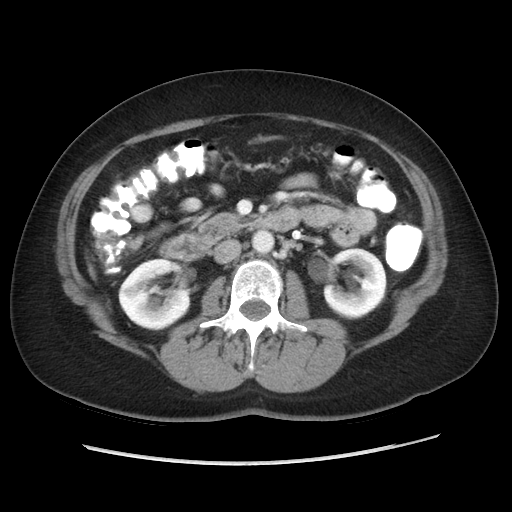
[im 45/81  lung]
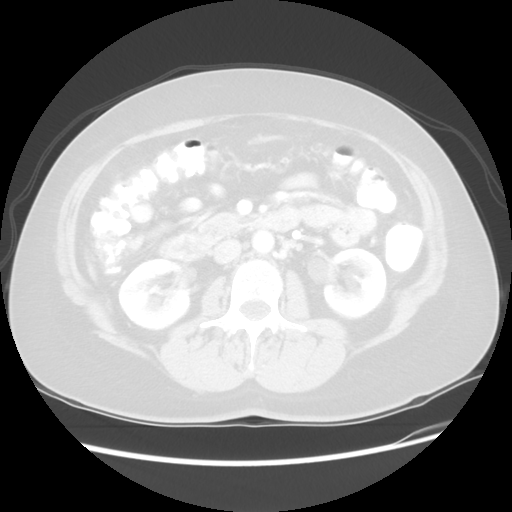
[im 54/81  soft-tissue]
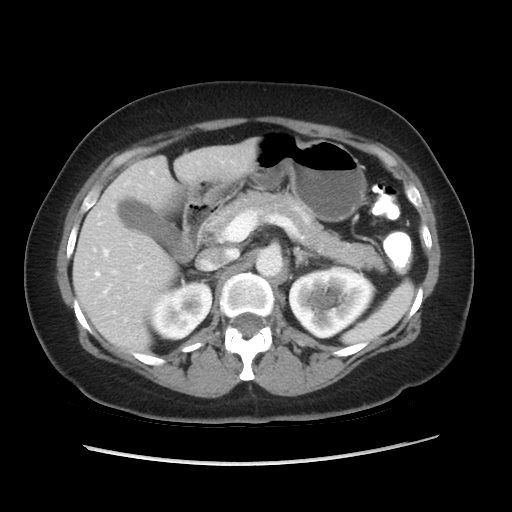
[im 54/81  lung]
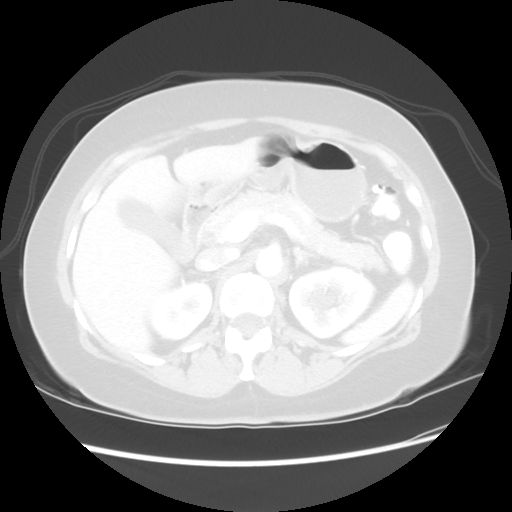
[im 63/81  soft-tissue]
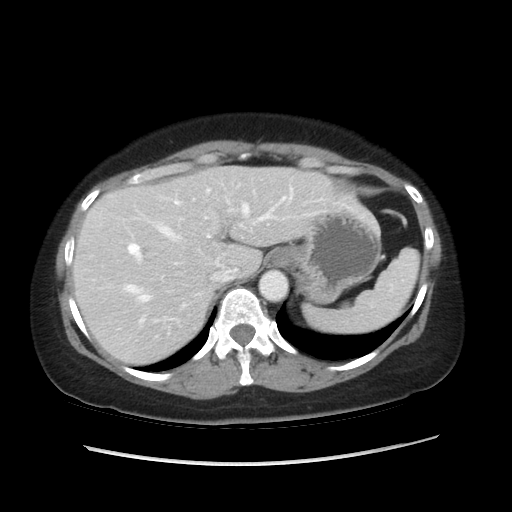
[im 63/81  lung]
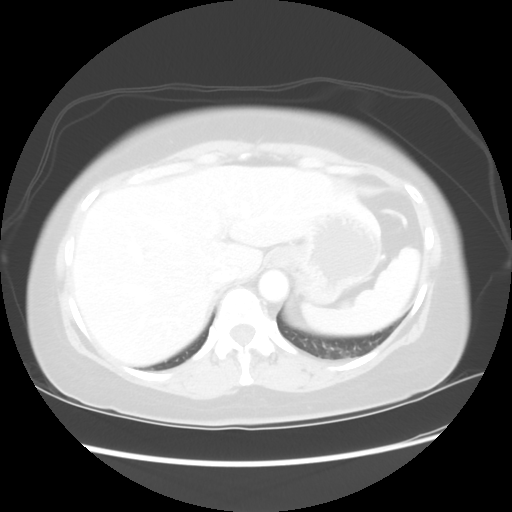
[im 72/81  soft-tissue]
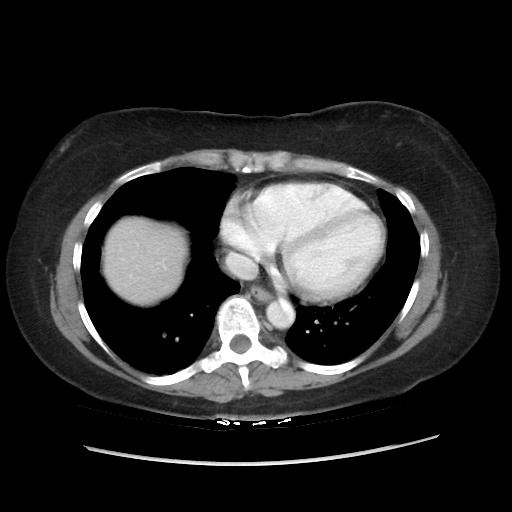
[im 72/81  lung]
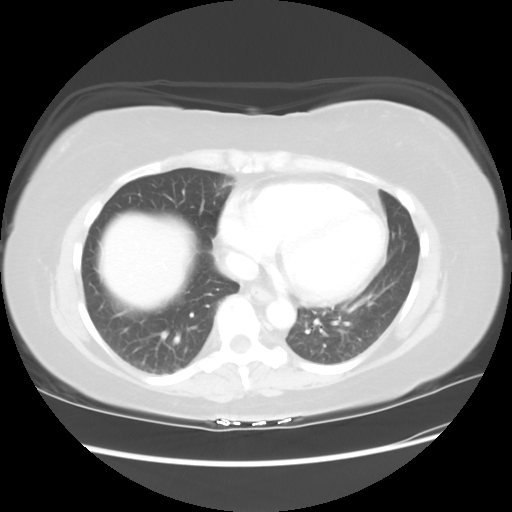

[Series 601: coronal body · coronal · 0.89mm/px · 1 of 110 slices shown, 2 images]
[im 37/110  soft-tissue]
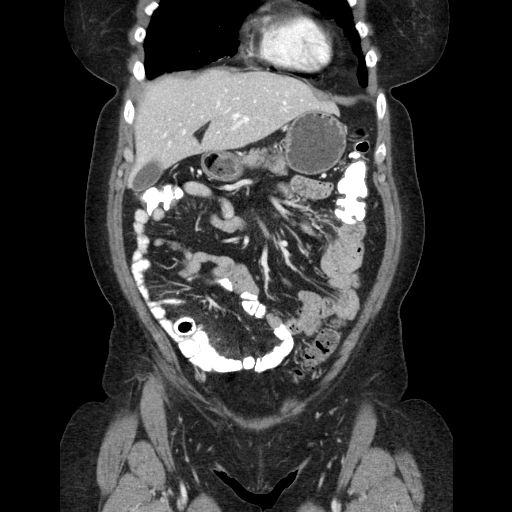
[im 37/110  bone]
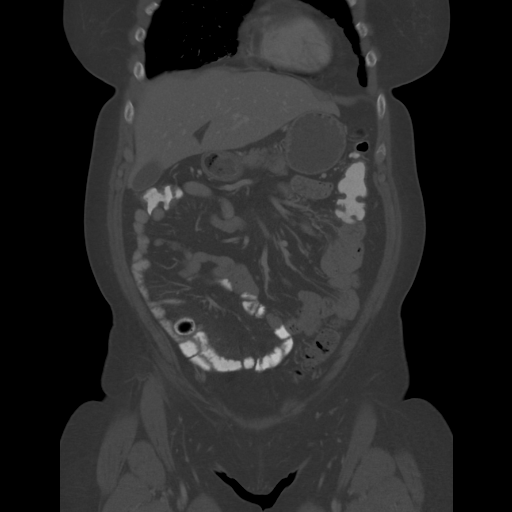

[9 of 36 positions shown; findings below may reference images not displayed]

FINDINGS: Lung bases are unremarkable. Sagittal images of the spine are
stable.

Enhanced liver shows no biliary ductal dilatation. Tiny cyst within
medial segment left hepatic lobe is stable. Measures about 4.5 mm.

No calcified gallstones are noted within gallbladder. The pancreas,
spleen and adrenal glands are unremarkable. Kidneys are symmetrical
in size and enhancement. No hydronephrosis or hydroureter.

Delayed renal images shows bilateral renal symmetrical excretion.

No aortic aneurysm.

No small bowel obstruction. No ascites or free air. No adenopathy.
Few diverticula are noted in descending colon. No evidence of acute
diverticulitis. Scattered diverticula are noted in sigmoid colon. No
evidence of acute diverticulitis. Moderate stool noted in sigmoid
colon and distal left colon. No pericolonic abscess.

There is a low lying cecum. Normal retrocecal appendix clearly
visualized in axial image 61. The urinary bladder is unremarkable.
Pelvic phleboliths are noted.

The patient is status post hysterectomy.
IMPRESSION: 1. No acute inflammatory process within abdomen or pelvis.
2. Scattered diverticula are noted left colon. Sigmoid colon
diverticula. There is no evidence of acute diverticulitis. Moderate
stool noted in distal left colon and sigmoid colon.
3. There is a low lying cecum. No pericecal inflammation. Normal
appendix.
4. The patient is status post hysterectomy.

## 2015-12-06 ENCOUNTER — Telehealth: Payer: Self-pay | Admitting: Neurology

## 2015-12-06 NOTE — Telephone Encounter (Signed)
Patient returned Danielle's call, states she is living in [place] now and will [place] in [place].

## 2015-12-06 NOTE — Telephone Encounter (Signed)
Called patient to see if she wanted to proceed with botox injections. If she calls back please ask her if she has any interest in continuing. If so she will need a follow up with Dr. Terrace ArabiaYan to get set up. Please schedule this.

## 2015-12-12 NOTE — Telephone Encounter (Signed)
Noted  

## 2016-10-15 IMAGING — CR DG ABDOMEN 2V
2 series · 2 of 2 positions shown · non-contrast
Comparison: None; correlation CT abdomen and pelvis 10/23/2012

CLINICAL DATA: Mid to lower abdominal pain for 4 days, generalized
abdominal pain, constipation, personal history of GERD and
diverticulosis

EXAM:
ABDOMEN - 2 VIEW

[w abdomen upright]
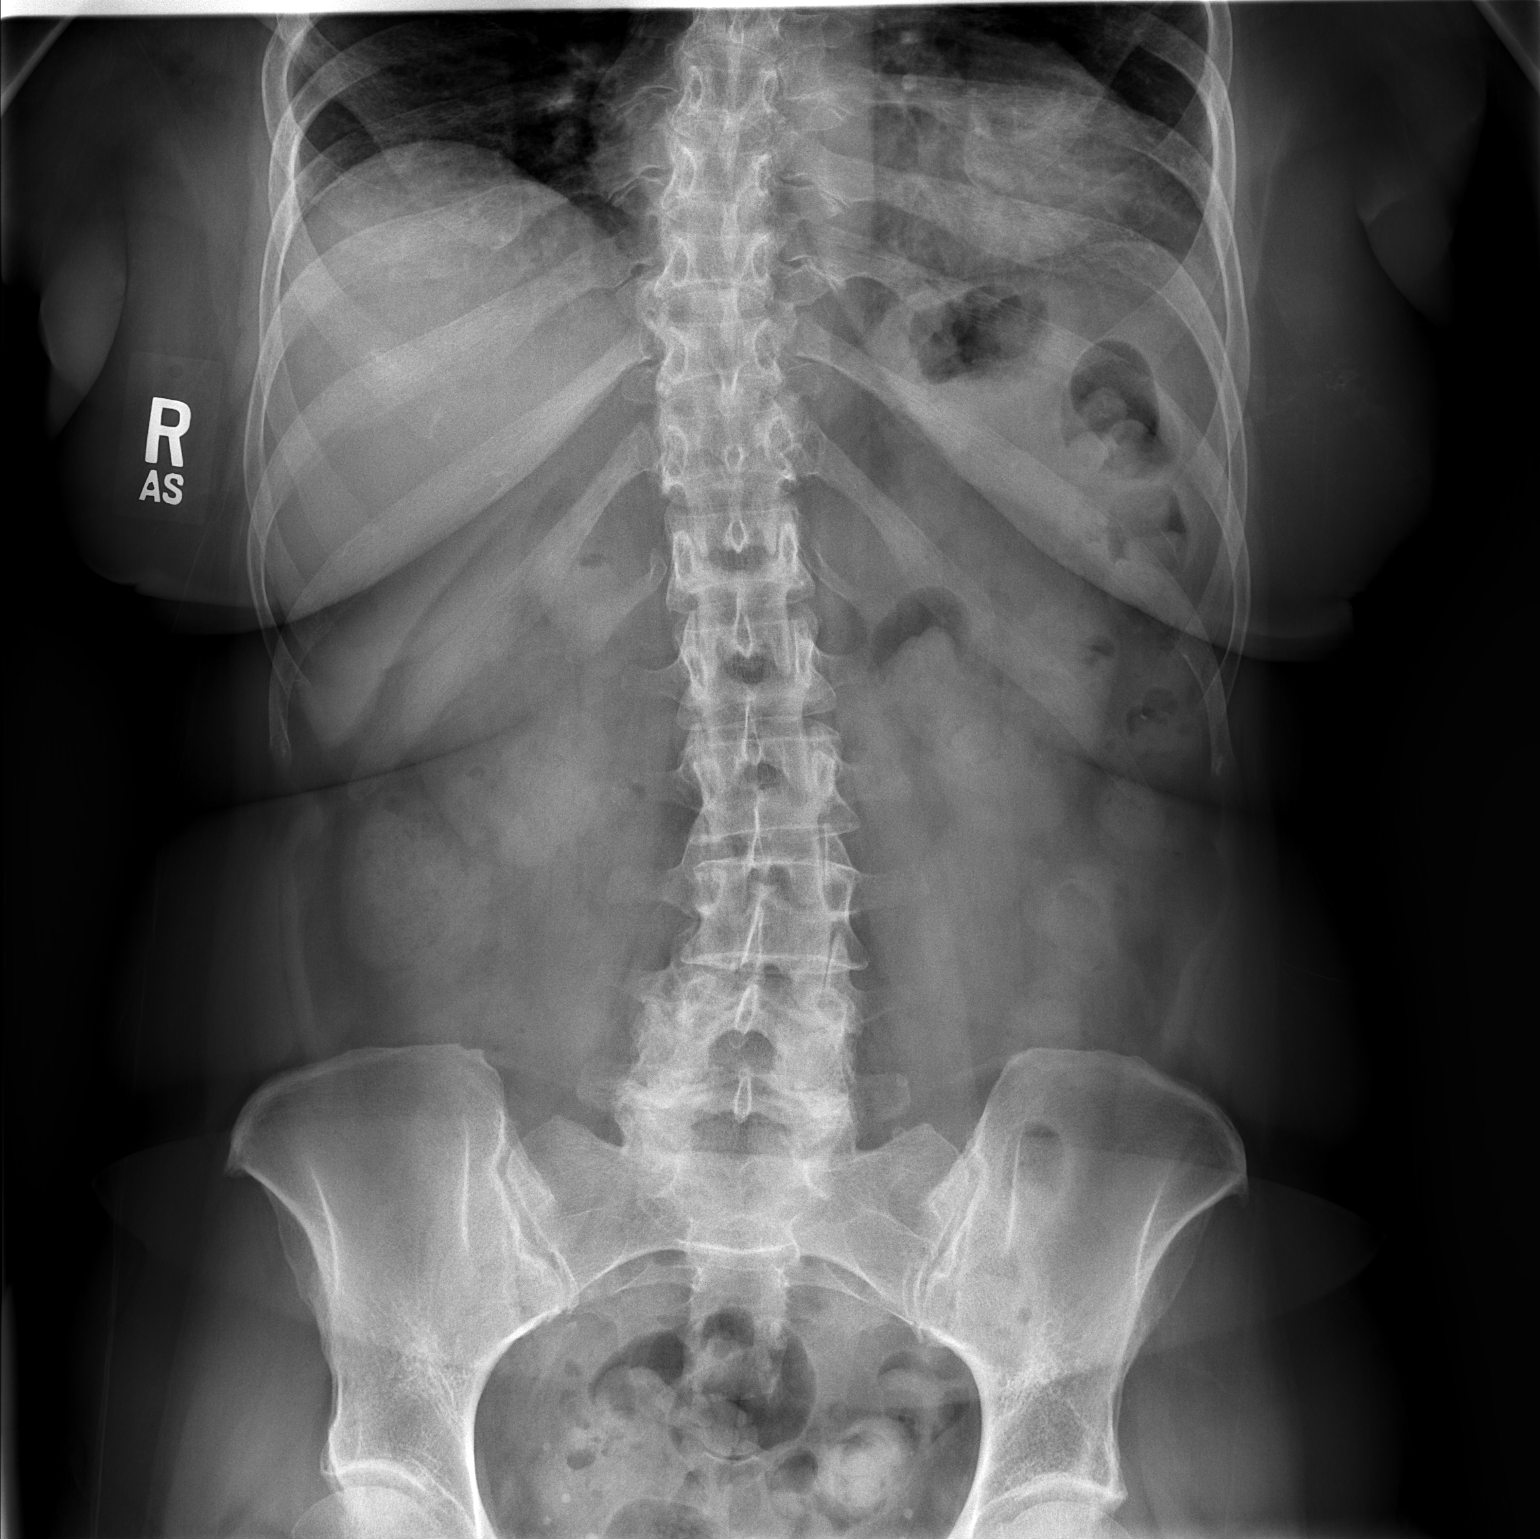

[t abdomen supine]
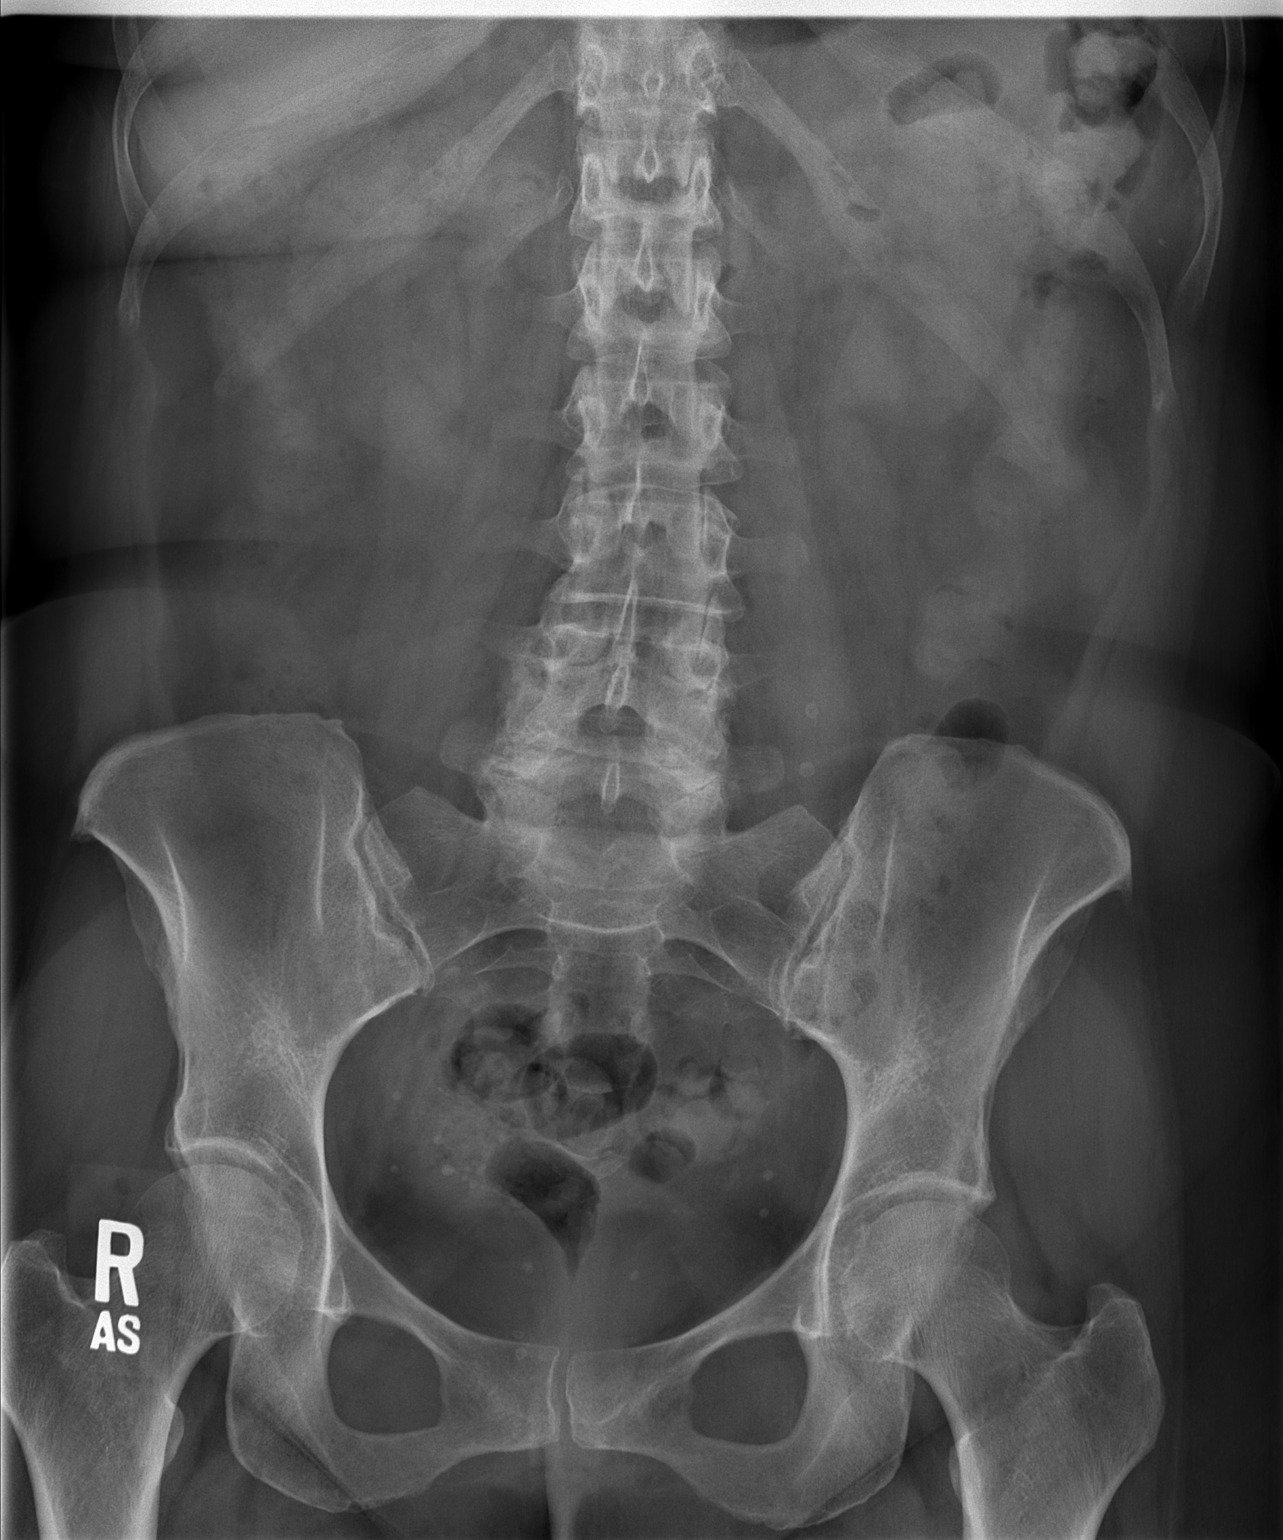

[2 of 2 positions shown; findings below may reference images not displayed]

FINDINGS: Biconvex thoracolumbar scoliosis.

Bones diffusely demineralized.

Scattered stool throughout colon.

Nonobstructive bowel gas pattern.

No bowel dilatation, bowel wall thickening or free intraperitoneal
air.

Calcified phleboliths in pelvis and LEFT paraspinal at L4-L5
unchanged from prior CT.

No definite urinary tract calcification.
IMPRESSION: No acute abnormalities.

## 2017-12-01 ENCOUNTER — Encounter: Payer: Self-pay | Admitting: Internal Medicine
